# Patient Record
Sex: Female | Born: 1935 | Race: White | Hispanic: No | Marital: Married | Smoking: Never smoker
Health system: Southern US, Community
[De-identification: ages and names within clinical notes are randomized; demographics above are authoritative.]

## PROBLEM LIST (undated history)

## (undated) DIAGNOSIS — T8859XA Other complications of anesthesia, initial encounter: Secondary | ICD-10-CM

## (undated) DIAGNOSIS — T4145XA Adverse effect of unspecified anesthetic, initial encounter: Secondary | ICD-10-CM

## (undated) DIAGNOSIS — H547 Unspecified visual loss: Secondary | ICD-10-CM

## (undated) DIAGNOSIS — G8929 Other chronic pain: Secondary | ICD-10-CM

## (undated) DIAGNOSIS — M549 Dorsalgia, unspecified: Secondary | ICD-10-CM

## (undated) HISTORY — PX: ABDOMINAL HYSTERECTOMY: SHX81

## (undated) HISTORY — PX: BACK SURGERY: SHX140

## (undated) HISTORY — PX: TONSILLECTOMY: SUR1361

---

## 2002-06-08 ENCOUNTER — Emergency Department (HOSPITAL_COMMUNITY): Admission: EM | Admit: 2002-06-08 | Discharge: 2002-06-08 | Payer: Self-pay | Admitting: Emergency Medicine

## 2002-06-08 ENCOUNTER — Encounter: Payer: Self-pay | Admitting: Emergency Medicine

## 2008-08-17 ENCOUNTER — Encounter: Admission: RE | Admit: 2008-08-17 | Discharge: 2008-08-17 | Payer: Self-pay | Admitting: Neurosurgery

## 2008-10-25 ENCOUNTER — Inpatient Hospital Stay (HOSPITAL_COMMUNITY): Admission: RE | Admit: 2008-10-25 | Discharge: 2008-11-02 | Payer: Self-pay | Admitting: Neurosurgery

## 2008-10-27 ENCOUNTER — Ambulatory Visit: Payer: Self-pay | Admitting: Physical Medicine & Rehabilitation

## 2008-11-02 ENCOUNTER — Inpatient Hospital Stay (HOSPITAL_COMMUNITY)
Admission: RE | Admit: 2008-11-02 | Discharge: 2008-11-22 | Payer: Self-pay | Admitting: Physical Medicine & Rehabilitation

## 2008-11-15 ENCOUNTER — Ambulatory Visit: Payer: Self-pay | Admitting: Physical Medicine & Rehabilitation

## 2008-12-30 ENCOUNTER — Encounter: Admission: RE | Admit: 2008-12-30 | Discharge: 2008-12-30 | Payer: Self-pay | Admitting: Neurosurgery

## 2009-12-04 IMAGING — CR DG LUMBAR SPINE 2-3V
3 series · 3 of 3 positions shown · non-contrast
Comparison: 10/25/2008

CLINICAL DATA: Low back pain, postop.

LUMBAR SPINE - 2-3 VIEW

[t l-spine a.p.]
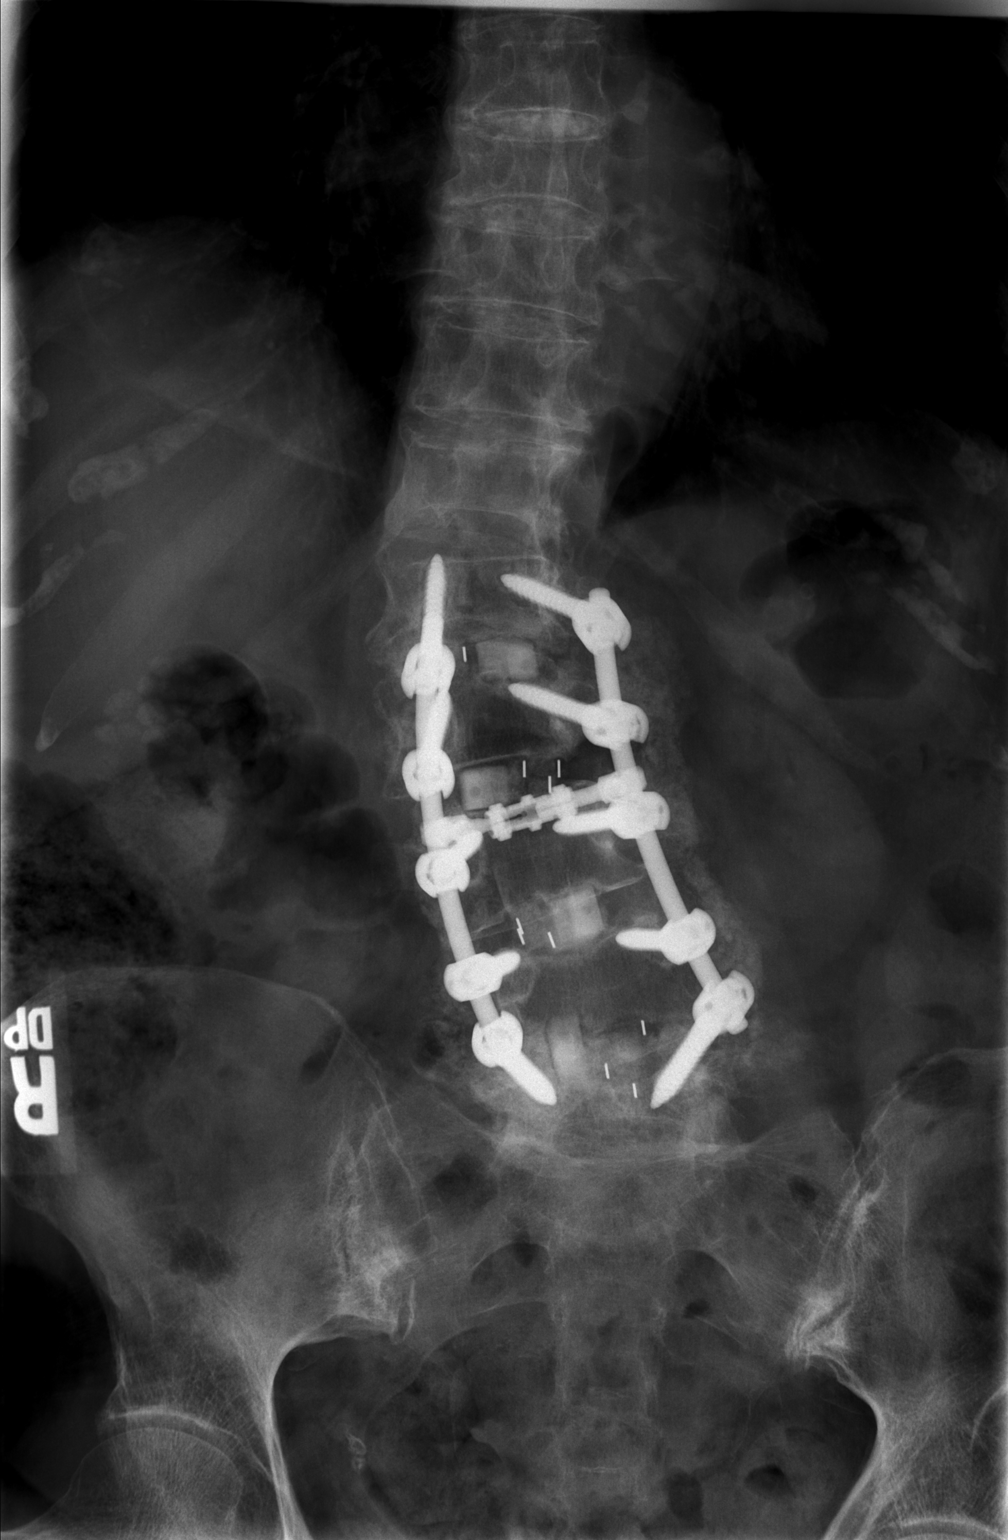

[t l-spine lat]
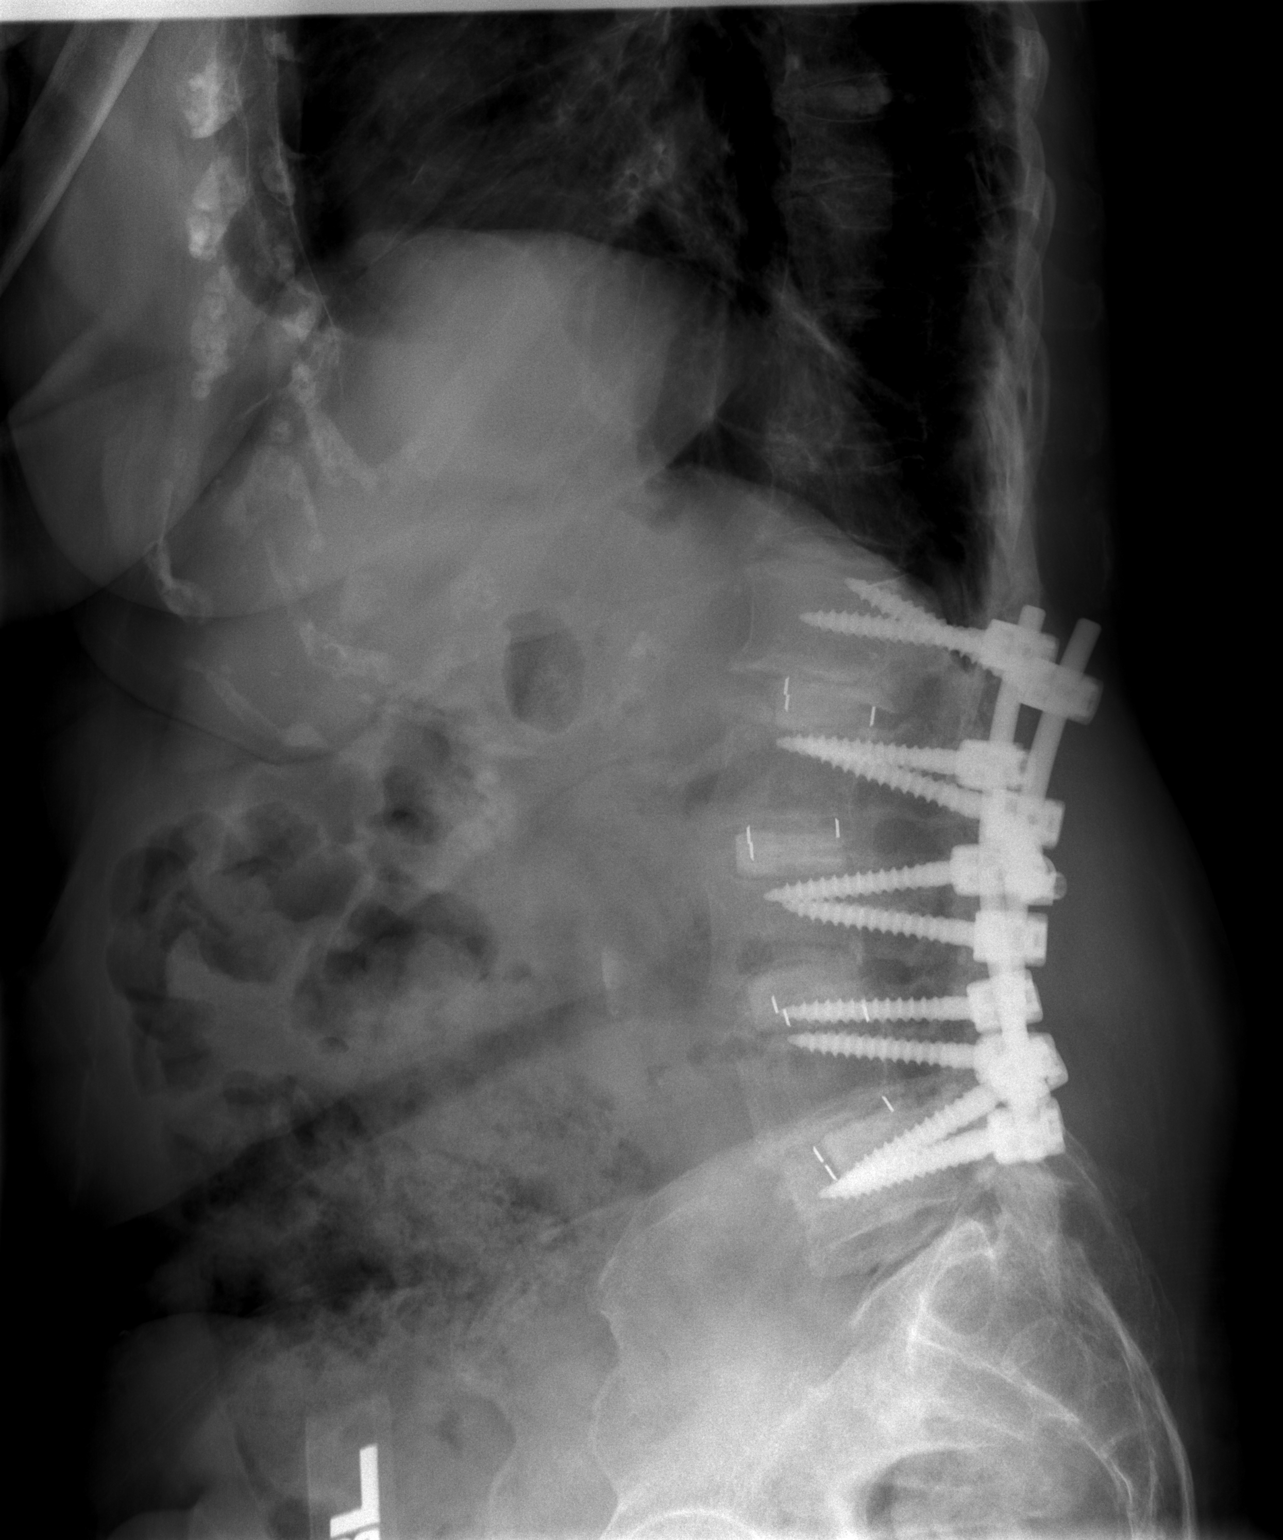

[t l-spine l5-s1 spot]
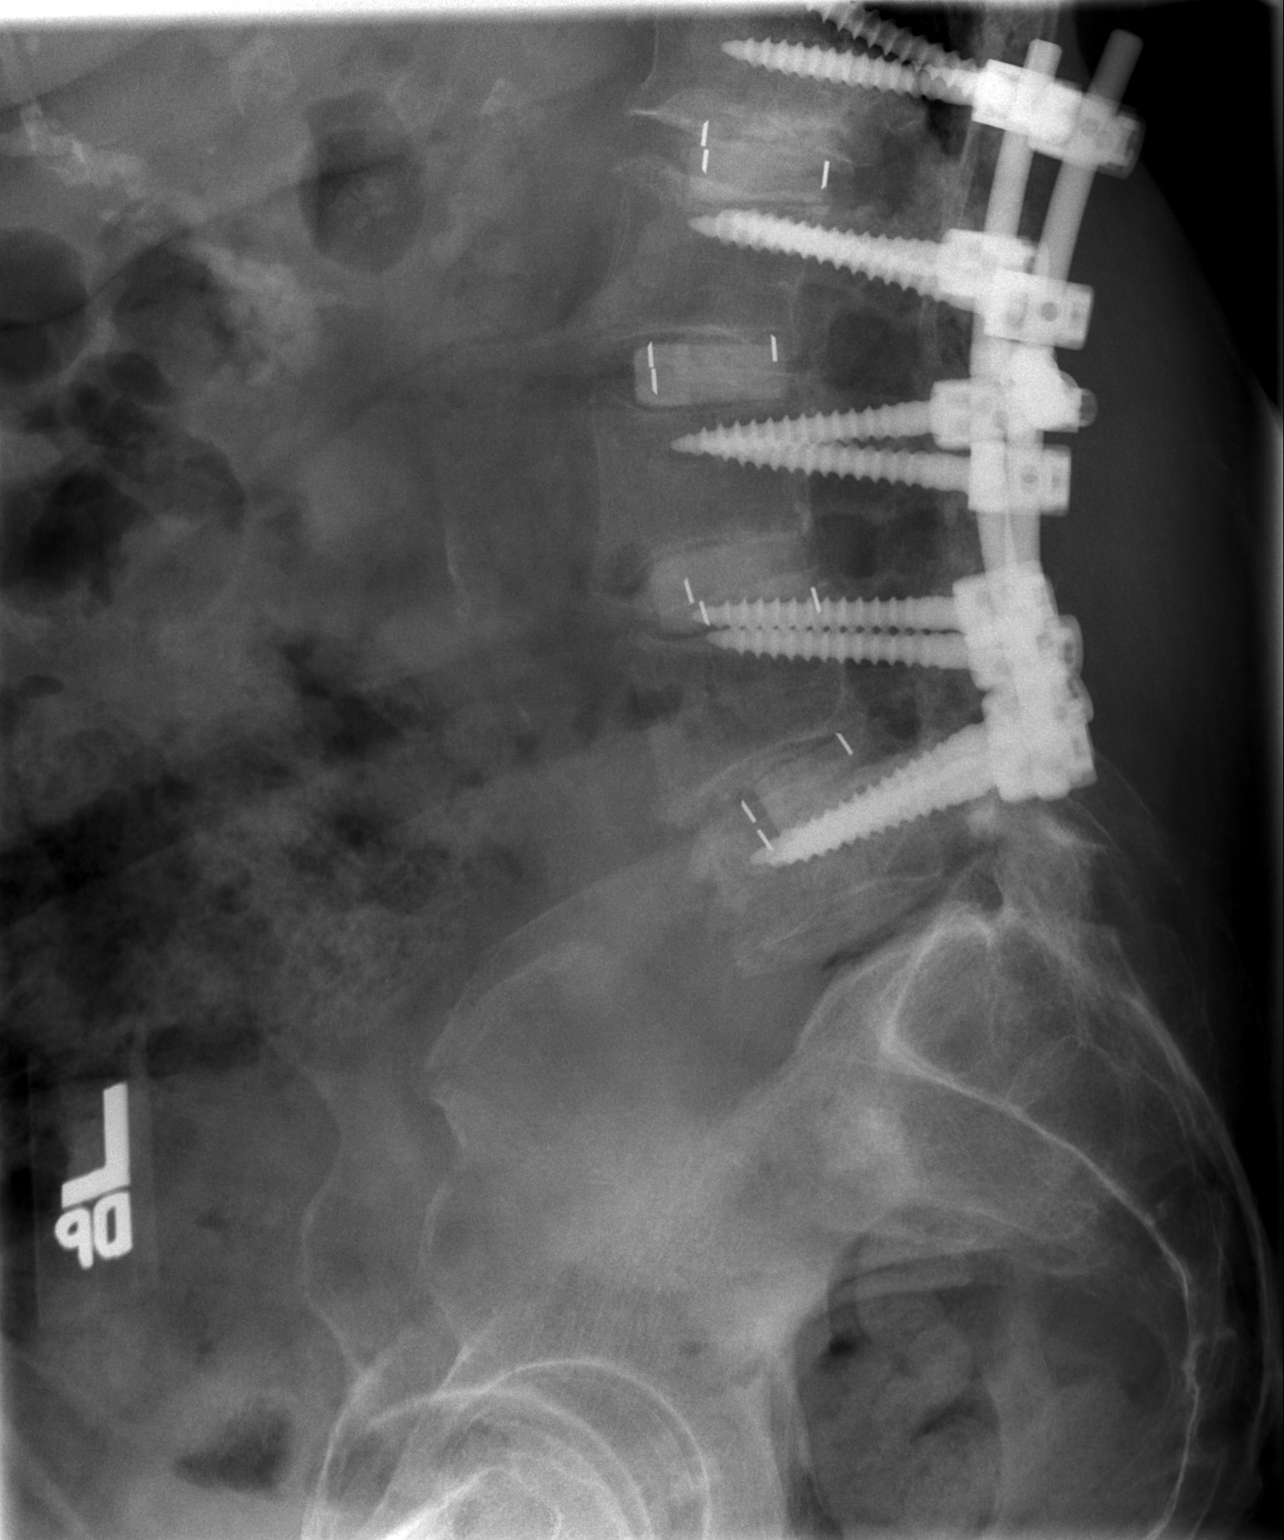

[3 of 3 positions shown; findings below may reference images not displayed]

FINDINGS: The patient is status post posterior fusion from L1-L5.
There is severe rightward scoliosis and associated degenerative
changes, stable since prior CT myelogram.  No hardware or bony
complicating feature.
IMPRESSION: Prior posterior fusion L1-L5.  Stable rightward scoliosis and
severe degenerative changes.  No complicating feature.

## 2010-06-02 LAB — SODIUM, URINE, RANDOM: Sodium, Ur: 11 mEq/L

## 2010-06-02 LAB — BRAIN NATRIURETIC PEPTIDE: Pro B Natriuretic peptide (BNP): 57 pg/mL (ref 0.0–100.0)

## 2010-06-02 LAB — BASIC METABOLIC PANEL
BUN: 14 mg/dL (ref 6–23)
BUN: 15 mg/dL (ref 6–23)
BUN: 15 mg/dL (ref 6–23)
BUN: 20 mg/dL (ref 6–23)
BUN: 20 mg/dL (ref 6–23)
BUN: 21 mg/dL (ref 6–23)
BUN: 23 mg/dL (ref 6–23)
CO2: 21 mEq/L (ref 19–32)
CO2: 22 mEq/L (ref 19–32)
CO2: 22 mEq/L (ref 19–32)
CO2: 23 mEq/L (ref 19–32)
CO2: 24 mEq/L (ref 19–32)
CO2: 24 mEq/L (ref 19–32)
CO2: 27 mEq/L (ref 19–32)
CO2: 27 mEq/L (ref 19–32)
CO2: 29 mEq/L (ref 19–32)
CO2: 29 mEq/L (ref 19–32)
CO2: 31 mEq/L (ref 19–32)
Calcium: 7.9 mg/dL — ABNORMAL LOW (ref 8.4–10.5)
Calcium: 8 mg/dL — ABNORMAL LOW (ref 8.4–10.5)
Calcium: 8.1 mg/dL — ABNORMAL LOW (ref 8.4–10.5)
Calcium: 8.3 mg/dL — ABNORMAL LOW (ref 8.4–10.5)
Calcium: 8.7 mg/dL (ref 8.4–10.5)
Chloride: 100 mEq/L (ref 96–112)
Chloride: 102 mEq/L (ref 96–112)
Chloride: 86 mEq/L — ABNORMAL LOW (ref 96–112)
Chloride: 89 mEq/L — ABNORMAL LOW (ref 96–112)
Chloride: 90 mEq/L — ABNORMAL LOW (ref 96–112)
Chloride: 90 mEq/L — ABNORMAL LOW (ref 96–112)
Chloride: 91 mEq/L — ABNORMAL LOW (ref 96–112)
Chloride: 93 mEq/L — ABNORMAL LOW (ref 96–112)
Chloride: 94 mEq/L — ABNORMAL LOW (ref 96–112)
Chloride: 101 mEq/L (ref 96–112)
Creatinine, Ser: 1.11 mg/dL (ref 0.4–1.2)
Creatinine, Ser: 1.29 mg/dL — ABNORMAL HIGH (ref 0.4–1.2)
Creatinine, Ser: 1.31 mg/dL — ABNORMAL HIGH (ref 0.4–1.2)
Creatinine, Ser: 1.44 mg/dL — ABNORMAL HIGH (ref 0.4–1.2)
Creatinine, Ser: 1.57 mg/dL — ABNORMAL HIGH (ref 0.4–1.2)
GFR calc Af Amer: 36 mL/min — ABNORMAL LOW (ref >60)
GFR calc Af Amer: 39 mL/min — ABNORMAL LOW (ref >60)
GFR calc Af Amer: 43 mL/min — ABNORMAL LOW (ref >60)
GFR calc Af Amer: 43 mL/min — ABNORMAL LOW (ref >60)
GFR calc Af Amer: >60 mL/min (ref >60)
GFR calc Af Amer: 37 mL/min — ABNORMAL LOW (ref >60)
GFR calc non Af Amer: 28 mL/min — ABNORMAL LOW (ref >60)
GFR calc non Af Amer: 30 mL/min — ABNORMAL LOW (ref >60)
GFR calc non Af Amer: 39 mL/min — ABNORMAL LOW (ref >60)
GFR calc non Af Amer: 42 mL/min — ABNORMAL LOW (ref >60)
GFR calc non Af Amer: 31 mL/min — ABNORMAL LOW (ref >60)
Glucose, Bld: 100 mg/dL — ABNORMAL HIGH (ref 70–99)
Glucose, Bld: 102 mg/dL — ABNORMAL HIGH (ref 70–99)
Glucose, Bld: 113 mg/dL — ABNORMAL HIGH (ref 70–99)
Glucose, Bld: 93 mg/dL (ref 70–99)
Glucose, Bld: 94 mg/dL (ref 70–99)
Glucose, Bld: 96 mg/dL (ref 70–99)
Glucose, Bld: 97 mg/dL (ref 70–99)
Glucose, Bld: 98 mg/dL (ref 70–99)
Glucose, Bld: 98 mg/dL (ref 70–99)
Potassium: 4.8 mEq/L (ref 3.5–5.1)
Potassium: 4.8 mEq/L (ref 3.5–5.1)
Potassium: 5 mEq/L (ref 3.5–5.1)
Potassium: 5.1 mEq/L (ref 3.5–5.1)
Potassium: 5.1 mEq/L (ref 3.5–5.1)
Potassium: 5.1 mEq/L (ref 3.5–5.1)
Potassium: 5.3 mEq/L — ABNORMAL HIGH (ref 3.5–5.1)
Potassium: 6 mEq/L — ABNORMAL HIGH (ref 3.5–5.1)
Potassium: 4.6 mEq/L (ref 3.5–5.1)
Sodium: 116 mEq/L — CL (ref 135–145)
Sodium: 118 mEq/L — CL (ref 135–145)
Sodium: 120 mEq/L — ABNORMAL LOW (ref 135–145)
Sodium: 123 mEq/L — ABNORMAL LOW (ref 135–145)
Sodium: 124 mEq/L — ABNORMAL LOW (ref 135–145)
Sodium: 126 mEq/L — ABNORMAL LOW (ref 135–145)
Sodium: 129 mEq/L — ABNORMAL LOW (ref 135–145)
Sodium: 131 mEq/L — ABNORMAL LOW (ref 135–145)

## 2010-06-02 LAB — DIFFERENTIAL
Basophils Absolute: 0 10*3/uL (ref 0.0–0.1)
Monocytes Absolute: 0.8 10*3/uL (ref 0.1–1.0)
Monocytes Relative: 9 % (ref 3–12)

## 2010-06-02 LAB — CBC
HCT: 24.7 % — ABNORMAL LOW (ref 36.0–46.0)
HCT: 26.8 % — ABNORMAL LOW (ref 36.0–46.0)
HCT: 28 % — ABNORMAL LOW (ref 36.0–46.0)
Hemoglobin: 8.3 g/dL — ABNORMAL LOW (ref 12.0–15.0)
Hemoglobin: 8.9 g/dL — ABNORMAL LOW (ref 12.0–15.0)
Hemoglobin: 8.8 g/dL — ABNORMAL LOW (ref 12.0–15.0)
Hemoglobin: 8.9 g/dL — ABNORMAL LOW (ref 12.0–15.0)
MCHC: 33.2 g/dL (ref 30.0–36.0)
MCHC: 33.4 g/dL (ref 30.0–36.0)
MCHC: 33.5 g/dL (ref 30.0–36.0)
MCHC: 32.8 g/dL (ref 30.0–36.0)
MCHC: 33.9 g/dL (ref 30.0–36.0)
MCV: 89.8 fL (ref 78.0–100.0)
MCV: 90.9 fL (ref 78.0–100.0)
MCV: 90.6 fL (ref 78.0–100.0)
MCV: 91.7 fL (ref 78.0–100.0)
Platelets: 246 10*3/uL (ref 150–400)
RBC: 2.75 MIL/uL — ABNORMAL LOW (ref 3.87–5.11)
RBC: 2.95 MIL/uL — ABNORMAL LOW (ref 3.87–5.11)
RBC: 3.12 MIL/uL — ABNORMAL LOW (ref 3.87–5.11)
RBC: 2.95 MIL/uL — ABNORMAL LOW (ref 3.87–5.11)
RBC: 3.09 MIL/uL — ABNORMAL LOW (ref 3.87–5.11)
RDW: 15.7 % — ABNORMAL HIGH (ref 11.5–15.5)
RDW: 15.8 % — ABNORMAL HIGH (ref 11.5–15.5)
RDW: 16 % — ABNORMAL HIGH (ref 11.5–15.5)
WBC: 3.2 10*3/uL — ABNORMAL LOW (ref 4.0–10.5)
WBC: 5.4 10*3/uL (ref 4.0–10.5)
WBC: 7.4 10*3/uL (ref 4.0–10.5)
WBC: 10.7 10*3/uL — ABNORMAL HIGH (ref 4.0–10.5)
WBC: 8.5 10*3/uL (ref 4.0–10.5)

## 2010-06-02 LAB — URINALYSIS, ROUTINE W REFLEX MICROSCOPIC
Bilirubin Urine: NEGATIVE
Glucose, UA: NEGATIVE mg/dL
Hgb urine dipstick: NEGATIVE
Ketones, ur: NEGATIVE mg/dL
Specific Gravity, Urine: 1.011 (ref 1.005–1.030)
pH: 6.5 (ref 5.0–8.0)

## 2010-06-02 LAB — URINE MICROSCOPIC-ADD ON

## 2010-06-02 LAB — COMPREHENSIVE METABOLIC PANEL
ALT: 12 U/L (ref 0–35)
AST: 22 U/L (ref 0–37)
AST: 19 U/L (ref 0–37)
Albumin: 2.7 g/dL — ABNORMAL LOW (ref 3.5–5.2)
Alkaline Phosphatase: 137 U/L — ABNORMAL HIGH (ref 39–117)
Alkaline Phosphatase: 64 U/L (ref 39–117)
CO2: 28 mEq/L (ref 19–32)
Calcium: 8.3 mg/dL — ABNORMAL LOW (ref 8.4–10.5)
Chloride: 93 mEq/L — ABNORMAL LOW (ref 96–112)
Chloride: 102 mEq/L (ref 96–112)
Creatinine, Ser: 1.29 mg/dL — ABNORMAL HIGH (ref 0.4–1.2)
GFR calc Af Amer: 48 mL/min — ABNORMAL LOW (ref >60)
GFR calc Af Amer: 53 mL/min — ABNORMAL LOW (ref >60)
GFR calc non Af Amer: 44 mL/min — ABNORMAL LOW (ref >60)
Glucose, Bld: 104 mg/dL — ABNORMAL HIGH (ref 70–99)
Potassium: 6.1 mEq/L — ABNORMAL HIGH (ref 3.5–5.1)
Potassium: 4.1 mEq/L (ref 3.5–5.1)
Sodium: 135 mEq/L (ref 135–145)
Total Bilirubin: 0.7 mg/dL (ref 0.3–1.2)
Total Protein: 5.6 g/dL — ABNORMAL LOW (ref 6.0–8.3)

## 2010-06-02 LAB — PROTIME-INR
INR: 2.1 — ABNORMAL HIGH (ref 0.00–1.49)
INR: 2.5 — ABNORMAL HIGH (ref 0.00–1.49)
INR: 1 (ref 0.00–1.49)
INR: 1.1 (ref 0.00–1.49)
INR: 1.4 (ref 0.00–1.49)
INR: 2.2 — ABNORMAL HIGH (ref 0.00–1.49)
INR: 2.2 — ABNORMAL HIGH (ref 0.00–1.49)
Prothrombin Time: 23.3 seconds — ABNORMAL HIGH (ref 11.6–15.2)
Prothrombin Time: 14.2 seconds (ref 11.6–15.2)
Prothrombin Time: 16.1 seconds — ABNORMAL HIGH (ref 11.6–15.2)
Prothrombin Time: 24 seconds — ABNORMAL HIGH (ref 11.6–15.2)
Prothrombin Time: 24.4 seconds — ABNORMAL HIGH (ref 11.6–15.2)

## 2010-06-02 LAB — LIPID PANEL: Cholesterol: 136 mg/dL (ref 0–200)

## 2010-06-02 LAB — URINE CULTURE: Colony Count: >=100000

## 2010-06-02 LAB — CHLORIDE, URINE, RANDOM: Chloride Urine: 16 mEq/L

## 2010-06-02 LAB — OSMOLALITY, URINE: Osmolality, Ur: 178 mOsm/kg — ABNORMAL LOW (ref 390–1090)

## 2010-06-02 LAB — VITAMIN B12: Vitamin B-12: 665 pg/mL (ref 211–911)

## 2010-06-02 LAB — CK: Total CK: 29 U/L (ref 7–177)

## 2010-06-03 LAB — CBC
HCT: 20.4 % — ABNORMAL LOW (ref 36.0–46.0)
HCT: 27.7 % — ABNORMAL LOW (ref 36.0–46.0)
HCT: 27.9 % — ABNORMAL LOW (ref 36.0–46.0)
Hemoglobin: 11 g/dL — ABNORMAL LOW (ref 12.0–15.0)
Hemoglobin: 9.3 g/dL — ABNORMAL LOW (ref 12.0–15.0)
Hemoglobin: 9.4 g/dL — ABNORMAL LOW (ref 12.0–15.0)
MCHC: 33.8 g/dL (ref 30.0–36.0)
MCHC: 34 g/dL (ref 30.0–36.0)
MCV: 89.8 fL (ref 78.0–100.0)
Platelets: 73 10*3/uL — ABNORMAL LOW (ref 150–400)
Platelets: 78 10*3/uL — ABNORMAL LOW (ref 150–400)
RBC: 3.09 MIL/uL — ABNORMAL LOW (ref 3.87–5.11)
RBC: 3.6 MIL/uL — ABNORMAL LOW (ref 3.87–5.11)
RDW: 16.2 % — ABNORMAL HIGH (ref 11.5–15.5)
RDW: 17.7 % — ABNORMAL HIGH (ref 11.5–15.5)
WBC: 11 10*3/uL — ABNORMAL HIGH (ref 4.0–10.5)
WBC: 8.1 10*3/uL (ref 4.0–10.5)
WBC: 8.1 10*3/uL (ref 4.0–10.5)

## 2010-06-03 LAB — POCT I-STAT 4, (NA,K, GLUC, HGB,HCT)
Glucose, Bld: 158 mg/dL — ABNORMAL HIGH (ref 70–99)
HCT: 24 % — ABNORMAL LOW (ref 36.0–46.0)
HCT: 25 % — ABNORMAL LOW (ref 36.0–46.0)
HCT: 30 % — ABNORMAL LOW (ref 36.0–46.0)
Hemoglobin: 10.2 g/dL — ABNORMAL LOW (ref 12.0–15.0)
Hemoglobin: 8.5 g/dL — ABNORMAL LOW (ref 12.0–15.0)
Potassium: 4.3 mEq/L (ref 3.5–5.1)
Sodium: 138 mEq/L (ref 135–145)

## 2010-06-03 LAB — BASIC METABOLIC PANEL
CO2: 27 mEq/L (ref 19–32)
Calcium: 9.6 mg/dL (ref 8.4–10.5)
Creatinine, Ser: 1.35 mg/dL — ABNORMAL HIGH (ref 0.4–1.2)
GFR calc Af Amer: 46 mL/min — ABNORMAL LOW (ref >60)
GFR calc non Af Amer: 38 mL/min — ABNORMAL LOW (ref >60)
Sodium: 139 mEq/L (ref 135–145)

## 2010-06-03 LAB — CROSSMATCH
ABO/RH(D): O POS
Antibody Screen: NEGATIVE

## 2010-06-03 LAB — APTT: aPTT: 29 seconds (ref 24–37)

## 2010-06-03 LAB — PROTIME-INR: INR: 1.7 — ABNORMAL HIGH (ref 0.00–1.49)

## 2010-06-03 LAB — PREPARE RBC (CROSSMATCH)

## 2010-06-03 LAB — DIFFERENTIAL
Lymphocytes Relative: 26 % (ref 12–46)
Monocytes Absolute: 0.7 10*3/uL (ref 0.1–1.0)
Monocytes Relative: 9 % (ref 3–12)
Neutro Abs: 4.9 10*3/uL (ref 1.7–7.7)
Neutrophils Relative %: 61 % (ref 43–77)

## 2010-06-03 LAB — ABO/RH: ABO/RH(D): O POS

## 2010-07-11 NOTE — Op Note (Signed)
Janice Barrett, Janice Barrett NO.:  [id number]   MEDICAL RECORD NO.:  [id number]          PATIENT TYPE:  INP   LOCATION:  2899                         FACILITY:  [facility]   PHYSICIAN:  Kathaleen Maser. Pool, M.D.    DATE OF BIRTH:  Mar 16, 1927   DATE OF PROCEDURE:  10/25/2008  DATE OF DISCHARGE:                               OPERATIVE REPORT   PREOPERATIVE DIAGNOSIS:  Lumbar degenerative scoliosis with lumbar  stenosis L1 through L5.   POSTOPERATIVE DIAGNOSIS:  Lumbar degenerative scoliosis with lumbar  stenosis L1 through L5.   PROCEDURE NOTE:  L1-2, L2-3, L3-4, and L4-5 decompressive laminectomy  with bilateral L1, L2, L3, L4, and L5 decompressive foraminotomies, more  than would be required for simple interbody fusion alone.  L1-2, L2-3,  L3-4, and L4-5 posterior lumbar fusion utilizing tangent interbody  allograft wedge, interbody PEEK cage, and local autografting.  L1  through L5 posterolateral arthrodesis utilizing segmental pedicle screw  sedation and local autograft.   SURGEON:  Kathaleen Maser. Pool, MD   ASSISTANT:  Donalee Citrin, MD   ANESTHESIA:  General endotracheal.   INDICATIONS:  Ms. Fesler is a 77 year old female with history of  severe back and lower extremity pain failing all conservative  management.  Workup demonstrates evidence of marked degenerative  scoliosis, which is decompensating with evidence of foraminal collapse  and stenosis.  The patient presents now for multilevel decompression and  fusion and hopes improving her symptoms.   OPERATIVE NOTE:  The patient was brought to the operating room, placed  on room table in supine position. After an adequate level of anesthesia  achieved, the patient was placed prone on Wilson frame, firmly padded,  the patient's lumbar regions prepped and draped sterilely.  A 10-blade  was used to make a curvilinear skin incision exposing the L1, L2, L3,  L4, and L5 levels as well as the transverse processes aforementioned  levels.  Deep self-retainer was placed. Intraoperative fluoroscopy was  used and levels were confirmed.  Decompressive laminectomy was then  performed using Leksell rongeurs, Kerrison rongeurs, high-speed drill to  remove the entire lamina of L1, L2, L3, and L4 as well as superior  aspect of the lamina of L5.  Inferior facetectomies L1, L2, L3, and L4  were performed bilaterally.  Superior facetectomies of L2, L3, L4, and  L5 were performed bilaterally.  Ligament flavum and epidural scar were  elevated and resected in a piecemeal fashion.  Wide decompressive  foraminotomy then performed along the course exiting L1, L2, L3, L4, and  L5 nerve roots.  Epidural venous plexus coagulated and cut.  Bilateral  diskectomies were performed at L4 levels.  Starting first at L1-2 disk  space was then distracted.  With disk space distracted, the disk space  was then reamed with 8-mm tangent box cutter and then cut with an 8-mm  tangent instruments. Soft tissue was then removed via interspace. A 8 x  22 mm Telamon cage was then packed in place on the right-sided, recessed  approximately 2 mm from posterior cortical margin.  Thecal sac nerve was  checked on the left side.  Distractors removed.  Disk space once again  reamed and then cut with 8-mm tangent instrument.  Soft tissues then  removed via interspace.  Disk space further curettaged.  Morselized  autograft packed into the interspace and then an 8 x 26 mm tangent wedge  was then packed into place and recessed approximately 1-2 mm from  posterior cortical margin.  Procedure then repeated at L2-3, L3-4, and  L4-5.  The 10-mm implants were used at L3-4 and L4-5.  Pedicles of L1,  L2, L3, L4, and L5 were then identified using surface landmarks with  intraoperative fluoroscopy.  Superficial bone was then removed using  high-speed drill. Each pedicle was then probed using pedicle awl.  Each  pedicle awl tract was then tapped.  Each tapped hole was then  probed and  found to be solid within bone.  The 5.75 x 40 mm radius screws placed  bilaterally at L2-L3 and L4.  The 6.75 x 40 mm screws placed bilaterally  at L5.  Transverse processes  at L1, L2, L3, L4, and L5 were then  decorticated and morselized autografts packed posterolaterally for later  fusion.  Short segment titanium rods were then placed over the screw  heads from L1-L5.  Locking caps were placed over the screw heads and  locking caps then engaged with construct under compression.  Transverse  connector was placed.  Final images were taken.  These revealed good  position of bone grafts and hardware with proper operative level and  normal alignment of spine.  Gelfoam was placed topically.  Hemostasis  and found to be good.  A medium Hemovac drains left at operative sites.  Surgical wounds then closed in layers with Vicryl suture.  Steri-Strips,  sterile dressings were applied. There are no complications.  She  tolerated the procedure well and she returns to recovery room  postoperatively.           ______________________________  Kathaleen Maser Pool, M.D.     HAP/MEDQ  D:  10/25/2008  T:  10/26/2008  Job:  [id number]

## 2011-11-20 ENCOUNTER — Other Ambulatory Visit (HOSPITAL_COMMUNITY): Payer: Self-pay | Admitting: Neurosurgery

## 2011-11-20 DIAGNOSIS — M48061 Spinal stenosis, lumbar region without neurogenic claudication: Secondary | ICD-10-CM

## 2011-11-26 ENCOUNTER — Ambulatory Visit (HOSPITAL_COMMUNITY)
Admission: RE | Admit: 2011-11-26 | Discharge: 2011-11-26 | Disposition: A | Discharge status: Home / Self Care | Payer: Medicare Other | Source: Ambulatory Visit | Attending: Neurosurgery | Admitting: Neurosurgery

## 2011-11-26 ENCOUNTER — Encounter (HOSPITAL_COMMUNITY): Payer: Self-pay

## 2011-11-26 ENCOUNTER — Ambulatory Visit (HOSPITAL_COMMUNITY): Payer: Medicare Other

## 2011-11-26 DIAGNOSIS — M48061 Spinal stenosis, lumbar region without neurogenic claudication: Secondary | ICD-10-CM | POA: Insufficient documentation

## 2011-11-26 DIAGNOSIS — S22009A Unspecified fracture of unspecified thoracic vertebra, initial encounter for closed fracture: Secondary | ICD-10-CM | POA: Insufficient documentation

## 2011-11-26 DIAGNOSIS — K8020 Calculus of gallbladder without cholecystitis without obstruction: Secondary | ICD-10-CM | POA: Insufficient documentation

## 2011-11-26 DIAGNOSIS — S322XXA Fracture of coccyx, initial encounter for closed fracture: Secondary | ICD-10-CM | POA: Insufficient documentation

## 2011-11-26 DIAGNOSIS — S3210XA Unspecified fracture of sacrum, initial encounter for closed fracture: Secondary | ICD-10-CM | POA: Insufficient documentation

## 2011-11-26 DIAGNOSIS — X58XXXA Exposure to other specified factors, initial encounter: Secondary | ICD-10-CM | POA: Insufficient documentation

## 2011-11-26 LAB — CREATININE, SERUM
GFR calc Af Amer: 43 mL/min — ABNORMAL LOW (ref >90)
GFR calc non Af Amer: 37 mL/min — ABNORMAL LOW (ref >90)

## 2011-11-26 MED ORDER — GADOBENATE DIMEGLUMINE 529 MG/ML IV SOLN
20.0000 mL | Freq: Once | INTRAVENOUS | Status: AC
  Administered 2011-11-26: 9 mL via INTRAVENOUS

## 2012-01-04 ENCOUNTER — Other Ambulatory Visit (HOSPITAL_COMMUNITY): Payer: Self-pay | Admitting: Neurosurgery

## 2012-01-04 DIAGNOSIS — IMO0002 Reserved for concepts with insufficient information to code with codable children: Secondary | ICD-10-CM

## 2012-01-09 ENCOUNTER — Other Ambulatory Visit (HOSPITAL_COMMUNITY): Payer: Self-pay | Admitting: Neurosurgery

## 2012-01-09 ENCOUNTER — Ambulatory Visit (HOSPITAL_COMMUNITY)
Admission: RE | Admit: 2012-01-09 | Discharge: 2012-01-09 | Disposition: A | Discharge status: Home / Self Care | Payer: Medicare Other | Source: Ambulatory Visit | Attending: Neurosurgery | Admitting: Neurosurgery

## 2012-01-09 DIAGNOSIS — IMO0002 Reserved for concepts with insufficient information to code with codable children: Secondary | ICD-10-CM

## 2012-01-09 MED ORDER — TOBRAMYCIN SULFATE 1.2 G IJ SOLR
INTRAMUSCULAR | Status: AC
  Filled 2012-01-09: qty 1.2

## 2012-01-10 ENCOUNTER — Other Ambulatory Visit (HOSPITAL_COMMUNITY): Payer: Self-pay | Admitting: Interventional Radiology

## 2012-01-10 DIAGNOSIS — S3210XA Unspecified fracture of sacrum, initial encounter for closed fracture: Secondary | ICD-10-CM

## 2012-01-11 ENCOUNTER — Other Ambulatory Visit: Payer: Self-pay | Admitting: Radiology

## 2012-01-11 ENCOUNTER — Encounter (HOSPITAL_COMMUNITY): Payer: Self-pay | Admitting: Pharmacy Technician

## 2012-01-14 ENCOUNTER — Other Ambulatory Visit: Payer: Self-pay | Admitting: Radiology

## 2012-01-14 ENCOUNTER — Ambulatory Visit (HOSPITAL_COMMUNITY)
Admission: RE | Admit: 2012-01-14 | Discharge: 2012-01-14 | Disposition: A | Discharge status: Home / Self Care | Payer: Medicare Other | Source: Ambulatory Visit | Attending: Interventional Radiology | Admitting: Interventional Radiology

## 2012-01-14 ENCOUNTER — Encounter (HOSPITAL_COMMUNITY): Payer: Self-pay

## 2012-01-14 DIAGNOSIS — W19XXXA Unspecified fall, initial encounter: Secondary | ICD-10-CM | POA: Insufficient documentation

## 2012-01-14 DIAGNOSIS — M8448XA Pathological fracture, other site, initial encounter for fracture: Secondary | ICD-10-CM | POA: Insufficient documentation

## 2012-01-14 DIAGNOSIS — S3210XA Unspecified fracture of sacrum, initial encounter for closed fracture: Secondary | ICD-10-CM

## 2012-01-14 DIAGNOSIS — Z538 Procedure and treatment not carried out for other reasons: Secondary | ICD-10-CM | POA: Insufficient documentation

## 2012-01-14 DIAGNOSIS — Z7901 Long term (current) use of anticoagulants: Secondary | ICD-10-CM | POA: Insufficient documentation

## 2012-01-14 DIAGNOSIS — I10 Essential (primary) hypertension: Secondary | ICD-10-CM | POA: Insufficient documentation

## 2012-01-14 LAB — CBC WITH DIFFERENTIAL/PLATELET
Basophils Absolute: 0.1 10*3/uL (ref 0.0–0.1)
Basophils Relative: 1 % (ref 0–1)
Eosinophils Absolute: 0.3 10*3/uL (ref 0.0–0.7)
HCT: 34.3 % — ABNORMAL LOW (ref 36.0–46.0)
MCH: 27.9 pg (ref 26.0–34.0)
MCHC: 31.5 g/dL (ref 30.0–36.0)
Monocytes Absolute: 0.7 10*3/uL (ref 0.1–1.0)
Monocytes Relative: 11 % (ref 3–12)
Neutro Abs: 3.8 10*3/uL (ref 1.7–7.7)
Neutrophils Relative %: 60 % (ref 43–77)
RDW: 15.6 % — ABNORMAL HIGH (ref 11.5–15.5)

## 2012-01-14 LAB — BASIC METABOLIC PANEL
BUN: 21 mg/dL (ref 6–23)
Calcium: 9.3 mg/dL (ref 8.4–10.5)
Chloride: 104 mEq/L (ref 96–112)
Creatinine, Ser: 1.28 mg/dL — ABNORMAL HIGH (ref 0.50–1.10)
GFR calc Af Amer: 44 mL/min — ABNORMAL LOW (ref >90)
GFR calc non Af Amer: 38 mL/min — ABNORMAL LOW (ref >90)

## 2012-01-14 LAB — APTT: aPTT: 39 seconds — ABNORMAL HIGH (ref 24–37)

## 2012-01-14 MED ORDER — CEFAZOLIN SODIUM 1-5 GM-% IV SOLN: 1.0000 g | INTRAVENOUS | Status: DC

## 2012-01-14 MED ORDER — SODIUM CHLORIDE 0.9 % IV SOLN: INTRAVENOUS | Status: DC

## 2012-01-14 NOTE — H&P (Signed)
Chief Complaint: Back pain  HPI: Janice Barrett is an 78 y.o. female who fell on her back  which resulted in excruciating pain to the lower back and sacral  area. The patient was initially seen by her neurosurgeon given  that she has had lumbar surgery in the past and MRI was performed  revealing sacral fractures bilaterally. The patient denies any  radicular symptoms down the legs or numbness; however describes  muscle pain of the upper legs bilaterally. The patient has had no  changes in her urinary stress incontinence. The patient denies  bowel incontinence. She rates her pain as a 10 on a scale of 1-10  without medications. With her Oxycodone her pain scale is reduced  to approximately five. Prior to this injury, the patient was able  to ambulate around the house and in the yard pretty well with the  assistance of a cane. Since her injury she has had limited  mobility secondary to pain. She is able to lay on her right side  to sleep at night. Sitting on hard surfaces or for extended period  of time, prolonged standing or change in position significantly  exacerbates her pain. Her MRI revealed S2 fractures and she is scheduled for augmentation.  Past Medical History:  1. History of stroke approximately 12 years ago on chronic  anticoagulation.  2. Hypertension  3. Hypercholesterolemia  4. Stress incontinence  5. Osteoporosis  6. Obesity  7. Chronic lumbar pain status post fusion from L1-L5  8. Glaucoma bilaterally with extremely poor visual acuity  Past Surgical History:  1. Lumbar fusion performed by Dr. Jordan Likes levels one through five  2. Surgery to both eyes x four at [facility] secondary to  glaucoma  3. Vaginal hysterectomy  4. Tonsillectomy as a child  Family History: No family history on file.  Social History:   The patient is married times 63 years and currently lives with her  husband. They have four living children. She was a housewife.  The patient denies  smoking or alcohol use.  Allergies:  Allergies  Allergen Reactions  . Septra (Sulfamethoxazole W-Trimethoprim) Hives    Medications: 1. Warfarin 5 mg daily x five; 7.5 mg 2 days of the week  2. Lisinopril 20 mg daily  3. Simvastatin 40 mg daily  4. Aspirin 81 mg daily  5. Oxycontin 10 mg 1-2 tablets every 6-8 hours as needed for pain   Please HPI for pertinent positives, otherwise complete 10 system ROS negative.  Physical Exam: Blood pressure 159/74, pulse 72, temperature 97.6 F (36.4 C), temperature source Oral, resp. rate 16, height 5\' 3"  (1.6 m), weight 181 lb (82.101 kg), SpO2 96.00%. Body mass index is 32.06 kg/(m^2).   General Appearance:  Alert, cooperative, no distress, appears stated age  Head:  Normocephalic, without obvious abnormality, atraumatic  ENT: Unremarkable  Neck: Supple, symmetrical, trachea midline, no adenopathy, thyroid: not enlarged, symmetric, no tenderness/mass/nodules  Lungs:   Clear to auscultation bilaterally, no w/r/r, respirations unlabored without use of accessory muscles.  Chest Wall:  No tenderness or deformity  Heart:  Regular rate and rhythm, S1, S2 normal, no murmur, rub or gallop. Carotids 2+ without bruit.  Neurologic: Normal affect, no gross deficits.   No results found for this or any previous visit (from the past 48 hour(s)). No results found.  Assessment/Plan Low back pain  Sacral fractures at S2 level. For Sacroplasty today Coumadin has been held, await labs. Procedure and risks discussed with pt and family.  Consent signed in chart  Brayton El PA-C 01/14/2012, 8:43 AM

## 2012-01-16 ENCOUNTER — Other Ambulatory Visit (HOSPITAL_COMMUNITY): Payer: Self-pay | Admitting: Interventional Radiology

## 2012-01-16 ENCOUNTER — Ambulatory Visit (HOSPITAL_COMMUNITY)
Admission: RE | Admit: 2012-01-16 | Discharge: 2012-01-16 | Disposition: A | Discharge status: Home / Self Care | Payer: Medicare Other | Source: Ambulatory Visit | Attending: Interventional Radiology | Admitting: Interventional Radiology

## 2012-01-16 VITALS — BP 122/48 | HR 72 | Temp 97.8°F | Resp 18 | Ht 63.0 in | Wt 181.0 lb

## 2012-01-16 DIAGNOSIS — I10 Essential (primary) hypertension: Secondary | ICD-10-CM | POA: Insufficient documentation

## 2012-01-16 DIAGNOSIS — S3210XA Unspecified fracture of sacrum, initial encounter for closed fracture: Secondary | ICD-10-CM

## 2012-01-16 DIAGNOSIS — Z8673 Personal history of transient ischemic attack (TIA), and cerebral infarction without residual deficits: Secondary | ICD-10-CM | POA: Insufficient documentation

## 2012-01-16 DIAGNOSIS — W19XXXA Unspecified fall, initial encounter: Secondary | ICD-10-CM | POA: Insufficient documentation

## 2012-01-16 DIAGNOSIS — G8929 Other chronic pain: Secondary | ICD-10-CM | POA: Insufficient documentation

## 2012-01-16 DIAGNOSIS — Z981 Arthrodesis status: Secondary | ICD-10-CM | POA: Insufficient documentation

## 2012-01-16 DIAGNOSIS — Z7901 Long term (current) use of anticoagulants: Secondary | ICD-10-CM | POA: Insufficient documentation

## 2012-01-16 DIAGNOSIS — M810 Age-related osteoporosis without current pathological fracture: Secondary | ICD-10-CM | POA: Insufficient documentation

## 2012-01-16 LAB — PROTIME-INR: Prothrombin Time: 14.7 seconds (ref 11.6–15.2)

## 2012-01-16 MED ORDER — SODIUM CHLORIDE 0.9 % IV SOLN: INTRAVENOUS | Status: DC

## 2012-01-16 MED ORDER — DEXAMETHASONE SODIUM PHOSPHATE 10 MG/ML IJ SOLN
INTRAMUSCULAR | Status: AC
  Administered 2012-01-16: 10 mg
  Filled 2012-01-16: qty 1

## 2012-01-16 MED ORDER — FAMOTIDINE 20 MG PO TABS
20.0000 mg | ORAL_TABLET | Freq: Once | ORAL | Status: AC
  Administered 2012-01-16: 20 mg via ORAL

## 2012-01-16 MED ORDER — CEFAZOLIN SODIUM-DEXTROSE 2-3 GM-% IV SOLR
2.0000 g | Freq: Once | INTRAVENOUS | Status: AC
  Administered 2012-01-16: 2 g via INTRAVENOUS
  Filled 2012-01-16: qty 50

## 2012-01-16 MED ORDER — HYDROMORPHONE HCL PF 1 MG/ML IJ SOLN
INTRAMUSCULAR | Status: AC
  Filled 2012-01-16: qty 1

## 2012-01-16 MED ORDER — TOBRAMYCIN SULFATE 1.2 G IJ SOLR
INTRAMUSCULAR | Status: AC
  Filled 2012-01-16: qty 1.2

## 2012-01-16 MED ORDER — SODIUM CHLORIDE 0.9 % IV SOLN: INTRAVENOUS | Status: AC

## 2012-01-16 MED ORDER — FENTANYL CITRATE 0.05 MG/ML IJ SOLN
INTRAMUSCULAR | Status: AC
  Filled 2012-01-16: qty 4

## 2012-01-16 MED ORDER — MIDAZOLAM HCL 2 MG/2ML IJ SOLN
INTRAMUSCULAR | Status: AC | PRN
  Administered 2012-01-16 (×2): 1 mg via INTRAVENOUS

## 2012-01-16 MED ORDER — GELATIN ABSORBABLE 12-7 MM EX MISC
CUTANEOUS | Status: AC
  Filled 2012-01-16: qty 1

## 2012-01-16 MED ORDER — FENTANYL CITRATE 0.05 MG/ML IJ SOLN
INTRAMUSCULAR | Status: AC | PRN
  Administered 2012-01-16 (×2): 25 ug via INTRAVENOUS

## 2012-01-16 MED ORDER — MIDAZOLAM HCL 2 MG/2ML IJ SOLN
INTRAMUSCULAR | Status: AC
  Filled 2012-01-16: qty 4

## 2012-01-16 MED ORDER — DEXAMETHASONE SODIUM PHOSPHATE 4 MG/ML IJ SOLN
4.0000 mg | Freq: Once | INTRAMUSCULAR | Status: DC
  Filled 2012-01-16: qty 1

## 2012-01-16 MED ORDER — FAMOTIDINE 20 MG PO TABS
ORAL_TABLET | ORAL | Status: AC
  Filled 2012-01-16: qty 1

## 2012-01-16 MED ORDER — IOHEXOL 300 MG/ML  SOLN
50.0000 mL | Freq: Once | INTRAMUSCULAR | Status: AC | PRN
  Administered 2012-01-16: 5 mL via INTRAVENOUS

## 2012-01-16 NOTE — Progress Notes (Signed)
PER PAMELA CAMPBELL CLIENT MAY RESUME COUMADIN TOMORROW

## 2012-01-16 NOTE — Progress Notes (Signed)
ICE PACK TO BACK 

## 2012-01-16 NOTE — Procedures (Signed)
S/P S1 vertebroplasty  

## 2012-01-16 NOTE — H&P (Signed)
Cc: painful sacral fractures. Patient returns today for sacroplasty if INR is WNL to safely proceed after coumadin has been held.   Brayton El, PA Physician Assistant Cosign Needed Radiology H&P 01/14/2012 8:43 AM  Related encounter: IR MCIR1/MCIR2/WLIR1 from 01/14/2012 in [facility] INTERVENTIONAL RADIOLOGY  Chief Complaint: Back pain  HPI: Janice Barrett is an 78 y.o. female who fell on her back  which resulted in excruciating pain to the lower back and sacral   area. The patient was initially seen by her neurosurgeon given   that she has had lumbar surgery in the past and MRI was performed   revealing sacral fractures bilaterally. The patient denies any   radicular symptoms down the legs or numbness; however describes   muscle pain of the upper legs bilaterally. The patient has had no   changes in her urinary stress incontinence. The patient denies   bowel incontinence. She rates her pain as a 10 on a scale of 1-10   without medications. With her Oxycodone her pain scale is reduced   to approximately five. Prior to this injury, the patient was able   to ambulate around the house and in the yard pretty well with the   assistance of a cane. Since her injury she has had limited   mobility secondary to pain. She is able to lay on her right side   to sleep at night. Sitting on hard surfaces or for extended period   of time, prolonged standing or change in position significantly   exacerbates her pain. Her MRI revealed S2 fractures and she is scheduled for augmentation.  Past Medical History:   1. History of stroke approximately 12 years ago on chronic   anticoagulation.   2. Hypertension   3. Hypercholesterolemia   4. Stress incontinence   5. Osteoporosis   6. Obesity   7. Chronic lumbar pain status post fusion from L1-L5   8. Glaucoma bilaterally with extremely poor visual acuity  Past Surgical History:   1. Lumbar fusion performed by Dr. Jordan Likes  levels one through five   2. Surgery to both eyes x four at [facility] secondary to   glaucoma   3. Vaginal hysterectomy   4. Tonsillectomy as a child  Family History: No family history on file.  Social History:   The patient is married times 63 years and currently lives with her   husband. They have four living children. She was a housewife.   The patient denies smoking or alcohol use.  Allergies:  Allergies   Allergen  Reactions   .  Septra (Sulfamethoxazole W-Trimethoprim)  Hives     Medications: 1. Warfarin 5 mg daily x five; 7.5 mg 2 days of the week   2. Lisinopril 20 mg daily   3. Simvastatin 40 mg daily   4. Aspirin 81 mg daily   5. Oxycontin 10 mg 1-2 tablets every 6-8 hours as needed for pain   Please HPI for pertinent positives, otherwise complete 10 system ROS negative.  Physical Exam: Blood pressure 159/74, pulse 72, temperature 97.6 F (36.4 C), temperature source Oral, resp. rate 16, height 5\' 3"  (1.6 m), weight 181 lb (82.101 kg), SpO2 96.00%. Body mass index is 32.06 kg/(m^2).     General Appearance:   Alert, cooperative, no distress, appears stated age   Head:   Normocephalic, without obvious abnormality, atraumatic   ENT:  Unremarkable   Neck:  Supple, symmetrical, trachea midline, no adenopathy, thyroid: not enlarged,  symmetric, no tenderness/mass/nodules   Lungs:    Clear to auscultation bilaterally, no w/r/r, respirations unlabored without use of accessory muscles.   Chest Wall:   No tenderness or deformity   Heart:   Regular rate and rhythm, S1, S2 normal, no murmur, rub or gallop. Carotids 2+ without bruit.   Neurologic:  Normal affect, no gross deficits.    No results found for this or any previous visit (from the past 48 hour(s)). No results found.  Assessment/Plan Low back pain   Sacral fractures at S2 level. For Sacroplasty today Coumadin has been held, await labs. Procedure and risks discussed with pt and family. Consent signed in  chart  Brayton El PA-C 01/14/2012, 8:43 AM    No significant changes in PE today.  Patient has continued to hold coumadin. She denies any significant changes in her pain since seen in consultation and denies any new injuries since that time.  Agree with PE as above.   New labs drawn today :   INR 1.17, PTT: 14.7   A/P/ :  Sacral insufficiency fractures amendable to sacroplasty. For procedure today as INR WNL to proceed. Written consent obtained.

## 2012-01-17 ENCOUNTER — Telehealth (HOSPITAL_COMMUNITY): Payer: Self-pay | Admitting: *Deleted

## 2012-01-18 ENCOUNTER — Other Ambulatory Visit: Payer: Self-pay | Admitting: Internal Medicine

## 2012-02-08 ENCOUNTER — Other Ambulatory Visit (HOSPITAL_COMMUNITY): Payer: Self-pay | Admitting: Interventional Radiology

## 2012-02-11 ENCOUNTER — Other Ambulatory Visit (HOSPITAL_COMMUNITY): Payer: Self-pay | Admitting: Interventional Radiology

## 2012-02-11 DIAGNOSIS — IMO0002 Reserved for concepts with insufficient information to code with codable children: Secondary | ICD-10-CM

## 2012-03-10 ENCOUNTER — Ambulatory Visit (HOSPITAL_COMMUNITY)
Admission: RE | Admit: 2012-03-10 | Discharge: 2012-03-10 | Disposition: A | Discharge status: Home / Self Care | Payer: Medicare Other | Source: Ambulatory Visit | Attending: Interventional Radiology | Admitting: Interventional Radiology

## 2012-03-10 DIAGNOSIS — IMO0002 Reserved for concepts with insufficient information to code with codable children: Secondary | ICD-10-CM

## 2012-08-25 ENCOUNTER — Other Ambulatory Visit (HOSPITAL_COMMUNITY): Payer: Self-pay | Admitting: Interventional Radiology

## 2012-08-25 DIAGNOSIS — M549 Dorsalgia, unspecified: Secondary | ICD-10-CM

## 2012-09-01 ENCOUNTER — Encounter (HOSPITAL_COMMUNITY): Payer: Self-pay | Admitting: Emergency Medicine

## 2012-09-01 ENCOUNTER — Ambulatory Visit (HOSPITAL_COMMUNITY)
Admission: RE | Admit: 2012-09-01 | Discharge: 2012-09-01 | Disposition: A | Discharge status: Home / Self Care | Payer: Medicare Other | Source: Ambulatory Visit | Attending: Interventional Radiology | Admitting: Interventional Radiology

## 2012-09-01 ENCOUNTER — Emergency Department (HOSPITAL_COMMUNITY)
Admission: EM | Admit: 2012-09-01 | Discharge: 2012-09-01 | Disposition: A | Discharge status: Home / Self Care | Payer: Medicare Other | Attending: Emergency Medicine | Admitting: Emergency Medicine

## 2012-09-01 DIAGNOSIS — Z881 Allergy status to other antibiotic agents status: Secondary | ICD-10-CM | POA: Insufficient documentation

## 2012-09-01 DIAGNOSIS — H543 Unqualified visual loss, both eyes: Secondary | ICD-10-CM | POA: Insufficient documentation

## 2012-09-01 DIAGNOSIS — M549 Dorsalgia, unspecified: Secondary | ICD-10-CM | POA: Insufficient documentation

## 2012-09-01 DIAGNOSIS — Z7901 Long term (current) use of anticoagulants: Secondary | ICD-10-CM | POA: Insufficient documentation

## 2012-09-01 DIAGNOSIS — Z7982 Long term (current) use of aspirin: Secondary | ICD-10-CM | POA: Insufficient documentation

## 2012-09-01 DIAGNOSIS — Z79899 Other long term (current) drug therapy: Secondary | ICD-10-CM | POA: Insufficient documentation

## 2012-09-01 DIAGNOSIS — G8929 Other chronic pain: Secondary | ICD-10-CM | POA: Insufficient documentation

## 2012-09-01 HISTORY — DX: Dorsalgia, unspecified: M54.9

## 2012-09-01 HISTORY — DX: Unspecified visual loss: H54.7

## 2012-09-01 HISTORY — DX: Other chronic pain: G89.29

## 2012-09-01 NOTE — ED Provider Notes (Signed)
History    This chart was scribed for non-physician practitioner Janice Forth, PA working with Janice Munch, MD by Janice Barrett, ED Scribe. This patient was seen in room TR06C/TR06C and the patient's care was started at 3:28 PM .   CSN: [id number]  Arrival date & time 09/01/12  1410    Chief Complaint  Patient presents with  . Back Pain    The history is provided by the patient. No language interpreter was used.     HPI Comments: Janice Barrett is a 79 y.o. female who presents to the Emergency Department complaining of chronic, unchanged back pain.  Pt reports that she had concrete placed in her back by her interventional radiologist (Dr. Corliss Barrett) 6 months ago and was advised to return if her pain did not improve.  Pt states she had an appointment with Dr. Corliss Barrett at 2 PM today but she reports that she was given the address for Urgent Care by mistake.  When she arrived at [facility] she states that she was was transported from there to the ER.  She denies changes in back pain over the past 6 months and notes that she did not intend to come to the emergency room.  Pt medicates regularly with oxycodone (3x/day) for her back pain.   Past Medical History  Diagnosis Date  . Back pain, chronic   . Blind     Past Surgical History  Procedure Laterality Date  . Back surgery      No family history on file.   History  Substance Use Topics  . Smoking status: Not on file  . Smokeless tobacco: Not on file  . Alcohol Use: No    OB History   Grav Para Term Preterm Abortions TAB SAB Ect Mult Living                   Review of Systems  Constitutional: Negative for diaphoresis, appetite change, fatigue and unexpected weight change.  HENT: Negative for mouth sores and neck stiffness.   Eyes: Negative for visual disturbance.  Respiratory: Negative for cough, chest tightness, shortness of breath and wheezing.   Gastrointestinal: Negative for nausea, vomiting, diarrhea  and constipation.  Endocrine: Negative for polydipsia, polyphagia and polyuria.  Genitourinary: Negative for urgency, frequency and hematuria.  Musculoskeletal: Positive for back pain.  Skin: Negative for rash.  Allergic/Immunologic: Negative for immunocompromised state.  Neurological: Negative for syncope and light-headedness.  Hematological: Does not bruise/bleed easily.  Psychiatric/Behavioral: Negative for sleep disturbance. The patient is not nervous/anxious.       Allergies  Septra  Home Medications   Current Outpatient Rx  Name  Route  Sig  Dispense  Refill  . aspirin 81 MG tablet   Oral   Take 81 mg by mouth daily.         . hydroxypropyl methylcellulose (ISOPTO TEARS) 2.5 % ophthalmic solution   Both Eyes   Place 1 drop into both eyes 4 (four) times daily as needed (dry eyes).          Marland Kitchen lisinopril (PRINIVIL,ZESTRIL) 20 MG tablet   Oral   Take 20 mg by mouth at bedtime.          Marland Kitchen oxyCODONE (OXYCONTIN) 10 MG 12 hr tablet   Oral   Take 10 mg by mouth 3 (three) times daily.         . simvastatin (ZOCOR) 40 MG tablet   Oral   Take 40 mg by mouth every evening.         Marland Kitchen  timolol (TIMOPTIC) 0.5 % ophthalmic solution   Right Eye   Place 1 drop into the right eye 2 (two) times daily.         Marland Kitchen warfarin (COUMADIN) 5 MG tablet   Oral   Take 5-7.5 mg by mouth daily. 5 mg daily except 7.5 mg on wednesdays and sundays          BP 140/51  Pulse 66  Temp(Src) 98.9 F (37.2 C) (Oral)  Resp 15  SpO2 96%  Physical Exam  Nursing note and vitals reviewed. Constitutional: She is oriented to person, place, and time. She appears well-developed and well-nourished. No distress.  HENT:  Head: Normocephalic and atraumatic.  Eyes: Conjunctivae are normal. Pupils are equal, round, and reactive to light.  Neck: Normal range of motion.  Cardiovascular: Normal rate and normal heart sounds.   No murmur heard. Pulmonary/Chest: Effort normal. No respiratory  distress. She has no wheezes.  Neurological: She is alert and oriented to person, place, and time. She exhibits normal muscle tone. Coordination normal.  Skin: Skin is warm and dry. No erythema.  Psychiatric: She has a normal mood and affect. Her behavior is normal.    ED Course  Procedures (including critical care time)   DIAGNOSTIC STUDIES: Oxygen Saturation is 96% on room air, normal by my interpretation.    COORDINATION OF CARE: 3:32 PM-Discussed treatment plan which includes consult with Dr. Corliss Barrett with pt at bedside and pt agreed to plan.     Labs Reviewed - No data to display  No results found.  1. Back pain, chronic     MDM  Janice Barrett presents for appointment with Dr Janice Barrett. Discussed with by our receptionist and with Dr. Corliss Barrett scheduler.  Patient is without acute change in her back pain. She has no neurological complaints.  Dr Janice Barrett scheduler met with the patient. She reports that the patient should be discharged from the emergency room and she will take her scheduled appointment. Patient discharged without further evaluation.  Janice Barrett was consulted about the patient and situation, but she was moved prior to his exam.   Janice Forth, PA-C 09/01/12 2352

## 2012-09-01 NOTE — ED Notes (Signed)
IR staff took pt to radiology to make an appointment and stated that she would review the d/c material with the pt and get the pt set up with another appt to see Dr. Robyn Haber.

## 2012-09-01 NOTE — ED Notes (Signed)
Pt. Stated, I have back pain, I had it cemented 6 months ago and told to come back if no better in 6 months.

## 2012-09-02 NOTE — ED Provider Notes (Signed)
  Medical screening examination/treatment/procedure(s) were performed by non-physician practitioner and as supervising physician I was immediately available for consultation/collaboration.    Gerhard Munch, MD 09/02/12 [phone]

## 2012-12-20 IMAGING — XA IR DG VERTEBROPLASTY FL
1 series · 14 of 24 positions shown · IV contrast (IODINE)
Comparison: MRI of the lumbosacral spine of 11/26/2011.

CLINICAL DATA: Severe sacral pain secondary to sacral
insufficiency fractures.

SACROPLASTY

[Series 300: spine · 14 of 33 slices shown]
[im 1/33]
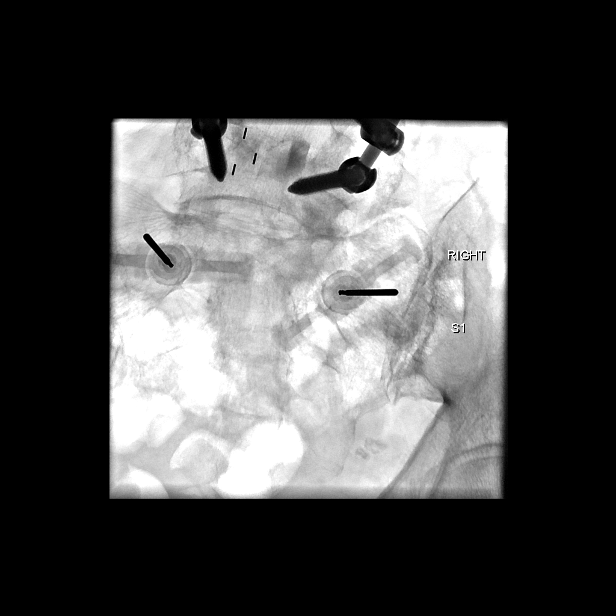
[im 3/33]
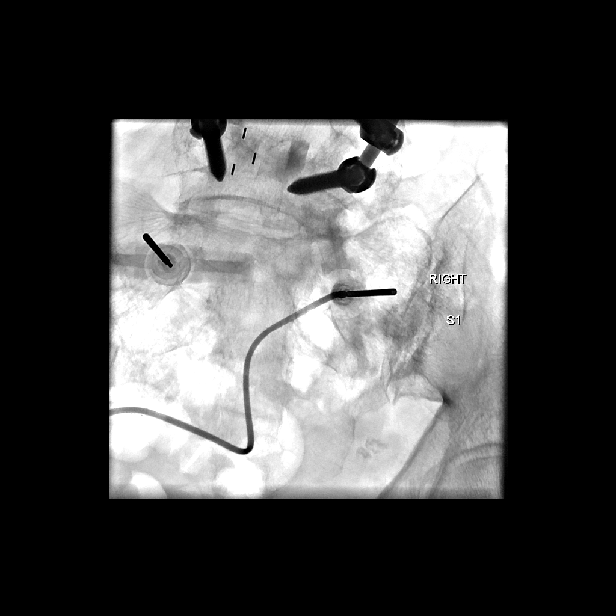
[im 6/33]
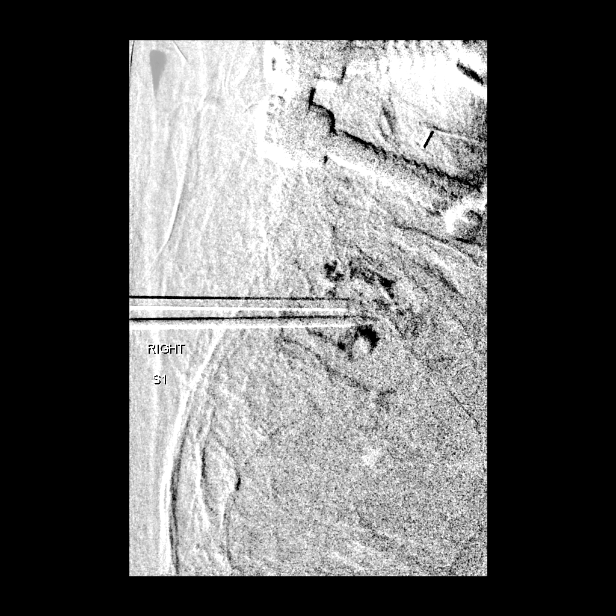
[im 9/33]
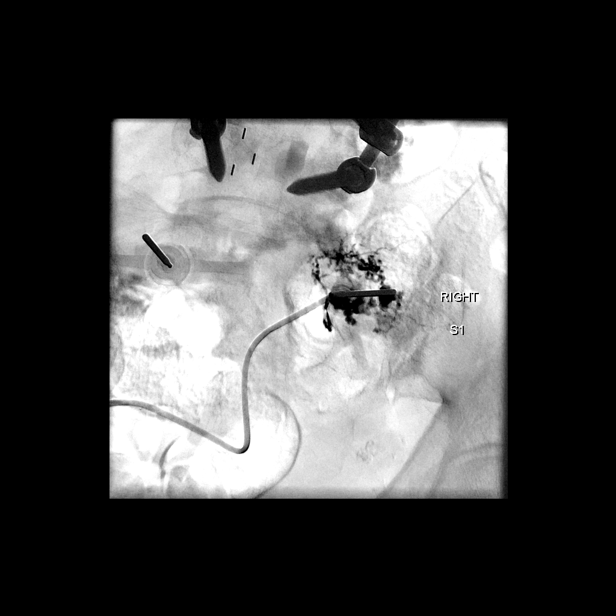
[im 10/33]
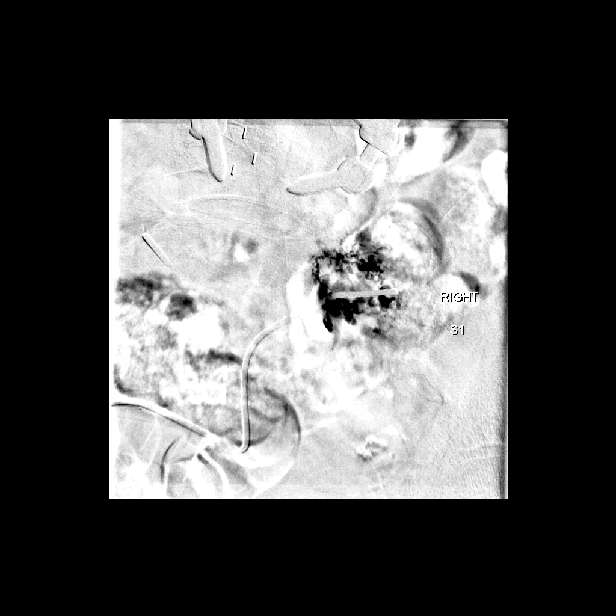
[im 13/33]
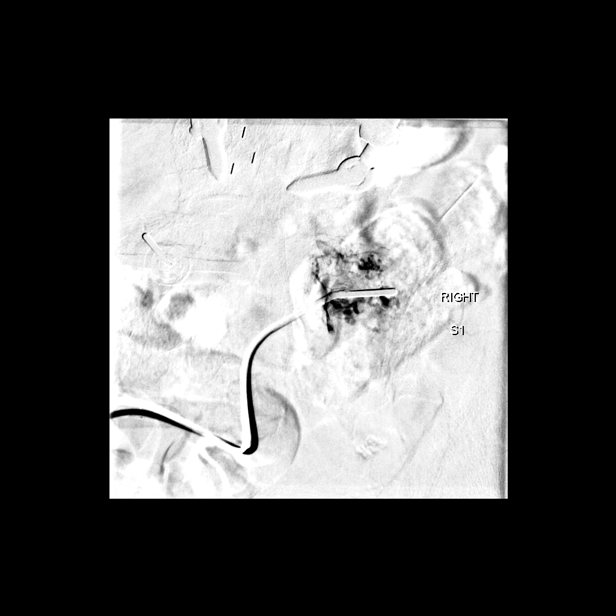
[im 16/33]
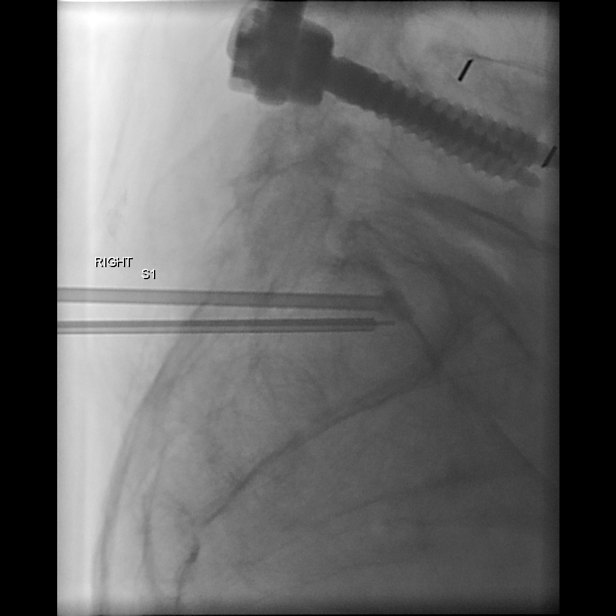
[im 17/33]
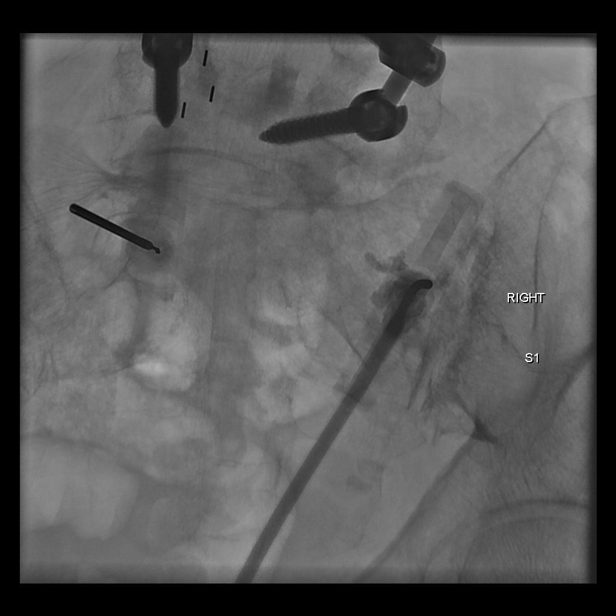
[im 20/33]
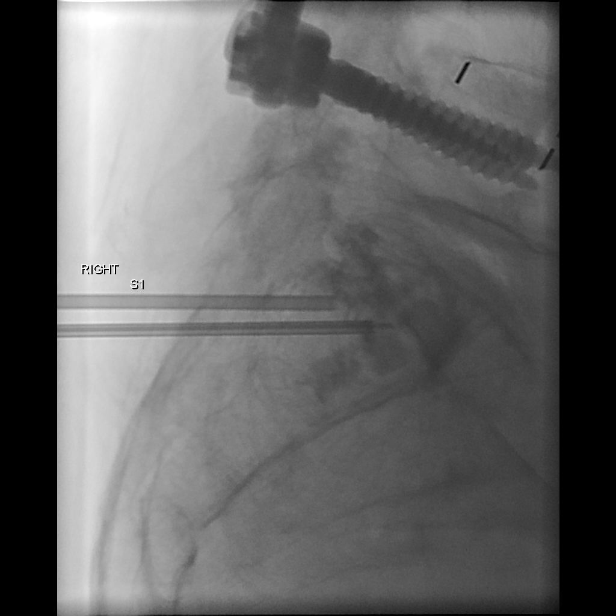
[im 23/33]
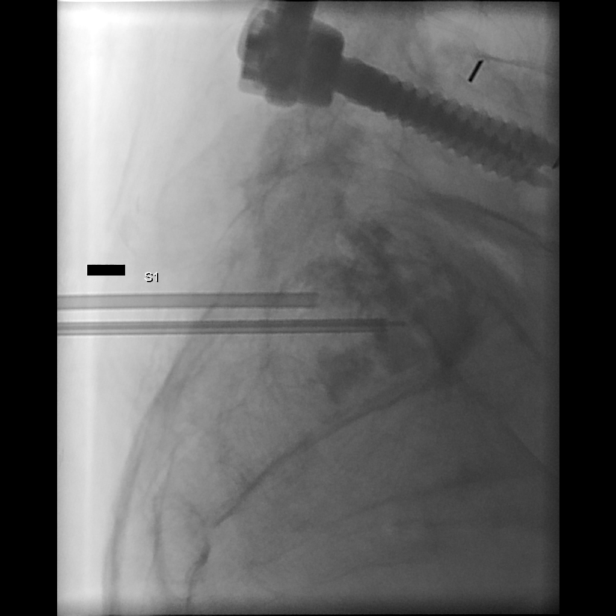
[im 26/33]
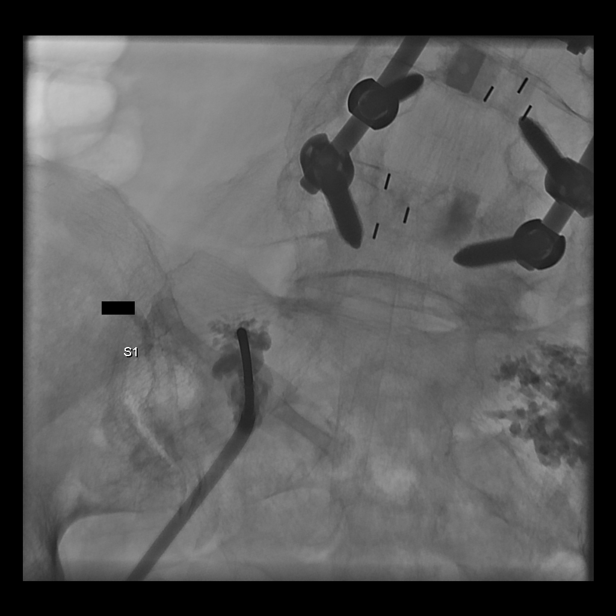
[im 27/33]
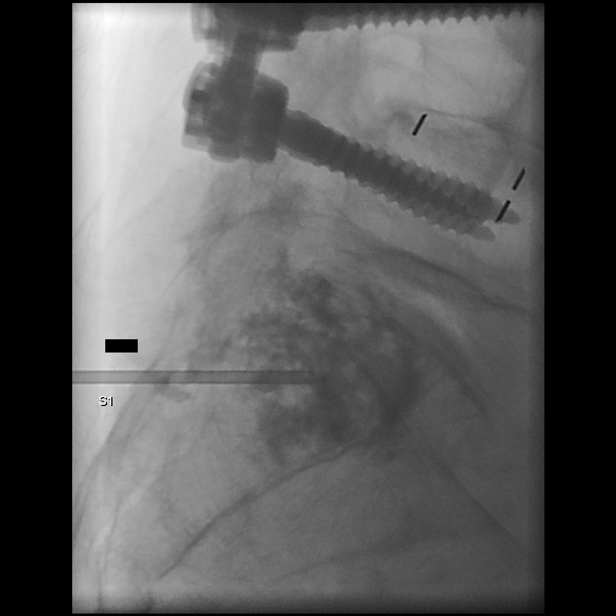
[im 30/33]
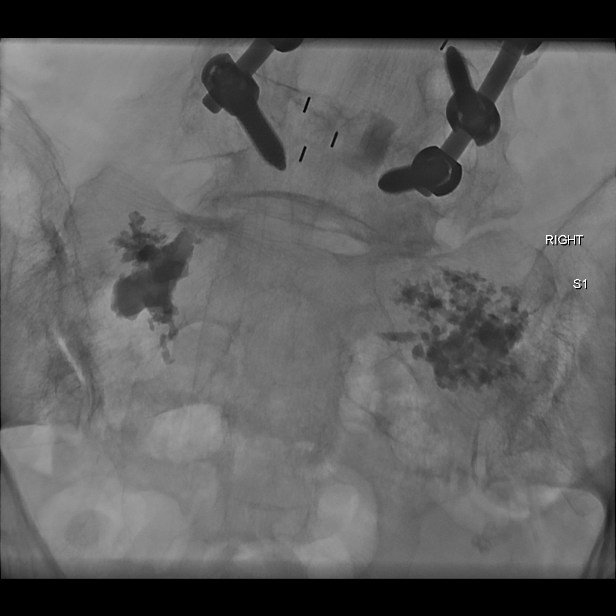
[im 33/33]
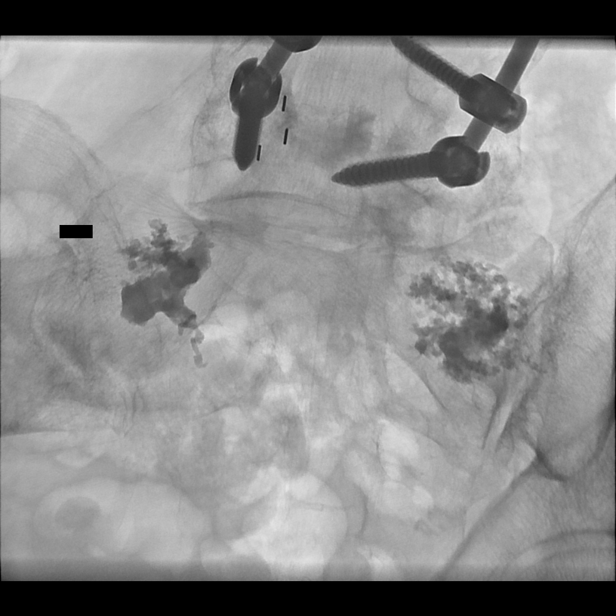

[14 of 24 positions shown; findings below may reference images not displayed]

Following a full explanation of the procedure along with the
potential associated complications, an informed witnessed consent
was obtained.

The patient was placed prone on the fluoroscopic table.

The skin overlying the lumbosacral region was then prepped and
draped in the usual sterile fashion.

The skin entry sites on either side of the midline of the S1
segment was then infiltrated with 0.25% bupivacaine and extended to
the posterior aspect of the bone.

Using biplane intermittent fluoroscopy, 13-gauge Deisy spine needles
were then advanced into the anterior third of the ala of the sacrum
bilaterally.

A gentle contrast injection demonstrated a trabecular pattern of
contrast enhancement with early opacification of presacral veins.

This necessitated the injection of Gelfoam pledgets through the
needles prior to the delivery of the methylmethacrylate mixture.

Methylmethacrylate mixture was then reconstituted with Tobramycin
in the Saheva delivery device system.

Using biplane intermittent fluoroscopy via microtubing connected to
the needles, methylmethacrylate mixture was then injected into the
sacrum using the Saheva delivery device system.  Excellent filling
was obtained bilaterally.  No extravasation of the
methylmethacrylate mixture was seen into the presacral space, or
the adjacent veins.

The osteo drills were then removed.  Hemostasis was achieved at the
skin entry sites.

The patient tolerated the procedure well.  There were no acute
complications.

Medications utilized: Versed 2 mg IV.  Fentanyl 50 mcg IV.

IMPRESSION
1.  Status post fluoroscopic-guided needle placement for vertebral
body augmentation for sacral insufficiency fractures at S1 using
the vertebroplasty technique.

## 2014-01-12 ENCOUNTER — Encounter (HOSPITAL_COMMUNITY): Payer: Self-pay | Admitting: Internal Medicine

## 2014-01-12 ENCOUNTER — Inpatient Hospital Stay (HOSPITAL_COMMUNITY)
Admission: AD | Admit: 2014-01-12 | Discharge: 2014-01-16 | DRG: 918 | Disposition: A | Discharge status: Skilled Nursing Facility | Payer: Medicare Other | Source: Other Acute Inpatient Hospital | Attending: Internal Medicine | Admitting: Internal Medicine

## 2014-01-12 DIAGNOSIS — F329 Major depressive disorder, single episode, unspecified: Secondary | ICD-10-CM | POA: Diagnosis present

## 2014-01-12 DIAGNOSIS — R197 Diarrhea, unspecified: Secondary | ICD-10-CM | POA: Diagnosis present

## 2014-01-12 DIAGNOSIS — F419 Anxiety disorder, unspecified: Secondary | ICD-10-CM | POA: Diagnosis present

## 2014-01-12 DIAGNOSIS — I509 Heart failure, unspecified: Secondary | ICD-10-CM | POA: Diagnosis present

## 2014-01-12 DIAGNOSIS — T45515A Adverse effect of anticoagulants, initial encounter: Principal | ICD-10-CM | POA: Diagnosis present

## 2014-01-12 DIAGNOSIS — R9431 Abnormal electrocardiogram [ECG] [EKG]: Secondary | ICD-10-CM | POA: Diagnosis present

## 2014-01-12 DIAGNOSIS — I10 Essential (primary) hypertension: Secondary | ICD-10-CM | POA: Diagnosis present

## 2014-01-12 DIAGNOSIS — Z8673 Personal history of transient ischemic attack (TIA), and cerebral infarction without residual deficits: Secondary | ICD-10-CM | POA: Diagnosis not present

## 2014-01-12 DIAGNOSIS — H409 Unspecified glaucoma: Secondary | ICD-10-CM | POA: Diagnosis present

## 2014-01-12 DIAGNOSIS — H543 Unqualified visual loss, both eyes: Secondary | ICD-10-CM | POA: Diagnosis present

## 2014-01-12 DIAGNOSIS — M549 Dorsalgia, unspecified: Secondary | ICD-10-CM | POA: Diagnosis present

## 2014-01-12 DIAGNOSIS — Z882 Allergy status to sulfonamides status: Secondary | ICD-10-CM | POA: Diagnosis not present

## 2014-01-12 DIAGNOSIS — K449 Diaphragmatic hernia without obstruction or gangrene: Secondary | ICD-10-CM | POA: Diagnosis present

## 2014-01-12 DIAGNOSIS — H540 Blindness, both eyes: Secondary | ICD-10-CM

## 2014-01-12 DIAGNOSIS — D62 Acute posthemorrhagic anemia: Secondary | ICD-10-CM | POA: Diagnosis present

## 2014-01-12 DIAGNOSIS — K922 Gastrointestinal hemorrhage, unspecified: Secondary | ICD-10-CM | POA: Diagnosis present

## 2014-01-12 DIAGNOSIS — Z91041 Radiographic dye allergy status: Secondary | ICD-10-CM

## 2014-01-12 DIAGNOSIS — Z7982 Long term (current) use of aspirin: Secondary | ICD-10-CM

## 2014-01-12 DIAGNOSIS — Z7901 Long term (current) use of anticoagulants: Secondary | ICD-10-CM

## 2014-01-12 DIAGNOSIS — I4891 Unspecified atrial fibrillation: Secondary | ICD-10-CM | POA: Diagnosis present

## 2014-01-12 DIAGNOSIS — G8929 Other chronic pain: Secondary | ICD-10-CM | POA: Diagnosis present

## 2014-01-12 DIAGNOSIS — R112 Nausea with vomiting, unspecified: Secondary | ICD-10-CM | POA: Diagnosis present

## 2014-01-12 DIAGNOSIS — I481 Persistent atrial fibrillation: Secondary | ICD-10-CM

## 2014-01-12 DIAGNOSIS — H548 Legal blindness, as defined in USA: Secondary | ICD-10-CM | POA: Diagnosis present

## 2014-01-12 DIAGNOSIS — E876 Hypokalemia: Secondary | ICD-10-CM | POA: Diagnosis not present

## 2014-01-12 DIAGNOSIS — Z79899 Other long term (current) drug therapy: Secondary | ICD-10-CM | POA: Diagnosis not present

## 2014-01-12 DIAGNOSIS — E785 Hyperlipidemia, unspecified: Secondary | ICD-10-CM | POA: Diagnosis present

## 2014-01-12 DIAGNOSIS — R791 Abnormal coagulation profile: Secondary | ICD-10-CM | POA: Diagnosis present

## 2014-01-12 DIAGNOSIS — K921 Melena: Secondary | ICD-10-CM

## 2014-01-12 HISTORY — DX: Other complications of anesthesia, initial encounter: T88.59XA

## 2014-01-12 HISTORY — DX: Adverse effect of unspecified anesthetic, initial encounter: T41.45XA

## 2014-01-12 LAB — CBC
HCT: 27.8 % — ABNORMAL LOW (ref 36.0–46.0)
HEMOGLOBIN: 8.4 g/dL — AB (ref 12.0–15.0)
MCH: 24.4 pg — ABNORMAL LOW (ref 26.0–34.0)
MCHC: 30.2 g/dL (ref 30.0–36.0)
MCV: 80.8 fL (ref 78.0–100.0)
Platelets: 196 10*3/uL (ref 150–400)
RBC: 3.44 MIL/uL — ABNORMAL LOW (ref 3.87–5.11)
RDW: 16.6 % — ABNORMAL HIGH (ref 11.5–15.5)
WBC: 5.4 10*3/uL (ref 4.0–10.5)

## 2014-01-12 MED ORDER — ACETAMINOPHEN 325 MG PO TABS: 650.0000 mg | ORAL_TABLET | Freq: Four times a day (QID) | ORAL | Status: DC | PRN

## 2014-01-12 MED ORDER — SODIUM CHLORIDE 0.9 % IJ SOLN
3.0000 mL | Freq: Two times a day (BID) | INTRAMUSCULAR | Status: DC
  Administered 2014-01-13 – 2014-01-15 (×2): 3 mL via INTRAVENOUS

## 2014-01-12 MED ORDER — ONDANSETRON HCL 4 MG/2ML IJ SOLN
4.0000 mg | Freq: Three times a day (TID) | INTRAMUSCULAR | Status: DC | PRN
  Administered 2014-01-14 – 2014-01-15 (×2): 4 mg via INTRAVENOUS
  Filled 2014-01-12 (×2): qty 2

## 2014-01-12 MED ORDER — HYPROMELLOSE (GONIOSCOPIC) 2.5 % OP SOLN: 1.0000 [drp] | Freq: Four times a day (QID) | OPHTHALMIC | Status: DC | PRN

## 2014-01-12 MED ORDER — OXYCODONE HCL ER 10 MG PO T12A
10.0000 mg | EXTENDED_RELEASE_TABLET | Freq: Three times a day (TID) | ORAL | Status: DC
  Administered 2014-01-13 – 2014-01-16 (×10): 10 mg via ORAL
  Filled 2014-01-12 (×13): qty 1

## 2014-01-12 MED ORDER — TIMOLOL MALEATE 0.5 % OP SOLN
1.0000 [drp] | Freq: Two times a day (BID) | OPHTHALMIC | Status: DC
  Administered 2014-01-13 – 2014-01-16 (×8): 1 [drp] via OPHTHALMIC
  Filled 2014-01-12: qty 5

## 2014-01-12 MED ORDER — SODIUM CHLORIDE 0.9 % IV SOLN
INTRAVENOUS | Status: DC
  Administered 2014-01-13 – 2014-01-14 (×2): via INTRAVENOUS

## 2014-01-12 MED ORDER — SIMVASTATIN 40 MG PO TABS
40.0000 mg | ORAL_TABLET | Freq: Every evening | ORAL | Status: DC
  Administered 2014-01-13 – 2014-01-14 (×2): 40 mg via ORAL
  Filled 2014-01-12 (×3): qty 1

## 2014-01-12 MED ORDER — PANTOPRAZOLE SODIUM 40 MG IV SOLR
40.0000 mg | Freq: Two times a day (BID) | INTRAVENOUS | Status: DC
  Administered 2014-01-13 (×2): 40 mg via INTRAVENOUS
  Filled 2014-01-12 (×3): qty 40

## 2014-01-12 MED ORDER — ACETAMINOPHEN 650 MG RE SUPP: 650.0000 mg | Freq: Four times a day (QID) | RECTAL | Status: DC | PRN

## 2014-01-12 MED ORDER — POLYVINYL ALCOHOL 1.4 % OP SOLN
1.0000 [drp] | Freq: Four times a day (QID) | OPHTHALMIC | Status: DC | PRN
  Administered 2014-01-14: 1 [drp] via OPHTHALMIC
  Filled 2014-01-12: qty 15

## 2014-01-12 NOTE — Progress Notes (Signed)
Pt admitted to the unit. Pt is stable, alert and oriented per baseline. Oriented to call bell. Educated to call for any assistance. Bed in lowest position, call bell within reach- will continue to monitor.

## 2014-01-12 NOTE — Plan of Care (Signed)
Problem: Phase I Progression Outcomes Goal: Pain controlled with appropriate interventions Outcome: Completed/Met Date Met:  01/12/14

## 2014-01-12 NOTE — H&P (Signed)
[facility] History and Physical  Janice RmDaisy G Minnich YNW:[id number] DOB: 09/15/1927 DOA: 01/12/2014  Referring physician: ED physician PCP: Temple PaciniPOOL,HENRY A, MD  Specialists:   Chief Complaint: Nausea, vomiting, diarrhea, GI bleeding, and supratherapeutic INR.  HPI: Janice Barrett is a 80 y.o. female with the past medical history of A. Fib on Coumadin, history of stroke, CHF,  blindness, hyperlipidemia, depression, anxiety, who presented with nausea, vomiting, diarrhea, GI bleeding, and supratherapeutic INR.  Patient reports that in the past 4 weeks, she has been having nausea, vomiting and diarrhea. She vomited occasionally with food materials. She denies any abdominal pain. She has mild diarrhea with 1-2 bowel movements with loose stools each day. Because of blindness, she does not know whether there is blood in the stools. She has generalized weakness. She does not have shortness of breath, chest pain, dizziness, palpitation. No fever, chills, weakness, numbness or tingling sensations in her extremities or leg edema.  [facility] She was noticed to have bloody stool there. Her INR was higher than 10 and received 10 mg of Vk. Her hemoglobin decreased from 11.3 on 12/2012 to 9.6. Troponin was negative. Patient's urinalysis is positive for leukocyte esterase, but denies any symptoms for UTI. Patient's EKG showed left bundle blockage, but no previous EKG to compare with. Patient does not have any chest pain.  Patient was transferred  [facility] to us because of no GI doctor there. She is admitted to inpatient for further evaluation and treatment.   Review of Systems: As presented in the history of presenting illness, rest negative.  Where does patient live? At home with her husband  Can patient participate in ADLs? Barely  Allergy:  Allergies  Allergen Reactions  . Septra [Sulfamethoxazole-Trimethoprim] Other (See Comments)    Severe hives     Past Medical History  Diagnosis Date  . Back pain, chronic   . Blind     Past Surgical History  Procedure Laterality Date  . Back surgery      Social History:  reports that she has never smoked. She does not have any smokeless tobacco history on file. She reports that she does not drink alcohol or use illicit drugs.  Family History:  Family History  Problem Relation Age of Onset  . Stomach cancer Father   . Heart attack Brother      Prior to Admission medications   Medication Sig Start Date End Date Taking? Authorizing Provider  aspirin 81 MG tablet Take 81 mg by mouth daily.    Historical Provider, MD  hydroxypropyl methylcellulose (ISOPTO TEARS) 2.5 % ophthalmic solution Place 1 drop into both eyes 4 (four) times daily as needed (dry eyes).     Historical Provider, MD  lisinopril (PRINIVIL,ZESTRIL) 20 MG tablet Take 20 mg by mouth at bedtime.     Historical Provider, MD  oxyCODONE (OXYCONTIN) 10 MG 12 hr tablet Take 10 mg by mouth 3 (three) times daily.    Historical Provider, MD  simvastatin (ZOCOR) 40 MG tablet Take 40 mg by mouth every evening.    Historical Provider, MD  timolol (TIMOPTIC) 0.5 % ophthalmic solution Place 1 drop into the right eye 2 (two) times daily.    Historical Provider, MD  warfarin (COUMADIN) 5 MG tablet Take 5-7.5 mg by mouth daily. 5 mg daily except 7.5 mg on wednesdays and sundays    Historical Provider, MD    Physical Exam: Filed Vitals:   01/12/14 2143 01/12/14 2145 01/13/14 [id number]  BP:  138/44   Pulse:  84   Temp:  97.5 F (36.4 C)   TempSrc:  Oral   Resp:  24   Height: 5\' 4"  (1.626 m)    Weight: 76.658 kg (169 lb)  76.658 kg (169 lb)  SpO2:  92%    General: Not in acute distress HEENT:       Eyes: blinded, no scleral icterus       ENT: No discharge from the ears and nose, no pharynx injection, no tonsillar enlargement.        Neck: No JVD, no bruit, no mass felt. Cardiac: S1/S2, RRR, 2/6 systolic murmurs, No gallops or  rubs Pulm: Good air movement bilaterally. Clear to auscultation bilaterally. No rales, wheezing, rhonchi or rubs. Abd: Soft, nondistended, nontender, no rebound pain, no organomegaly, BS present Ext: No edema bilaterally. 2+DP/PT pulse bilaterally Musculoskeletal: No joint deformities, erythema, or stiffness, ROM full Skin: No rashes.  Neuro: Alert and oriented X3, cranial nerves II-XII grossly intact, muscle strength 5/5 in all extremeties, sensation to light touch intact. Moves all extremities.   Labs on Admission:  Basic Metabolic Panel: No results for input(s): NA, K, CL, CO2, GLUCOSE, BUN, CREATININE, CALCIUM, MG, PHOS in the last 168 hours. Liver Function Tests: No results for input(s): AST, ALT, ALKPHOS, BILITOT, PROT, ALBUMIN in the last 168 hours. No results for input(s): LIPASE, AMYLASE in the last 168 hours. No results for input(s): AMMONIA in the last 168 hours. CBC:  Recent Labs Lab 01/12/14 2246  WBC 5.4  HGB 8.4*  HCT 27.8*  MCV 80.8  PLT 196   Cardiac Enzymes:  Recent Labs Lab 01/13/14 0033  TROPONINI <0.30    BNP (last 3 results) No results for input(s): PROBNP in the last 8760 hours. CBG: No results for input(s): GLUCAP in the last 168 hours.  Radiological Exams on Admission: No results found.  EKG: done in [facility], showed LBBB.   Assessment/Plan Principal Problem:   GI bleeding Active Problems:   Supratherapeutic INR   Congestive heart failure   Essential hypertension   Hyperlipidemia   Blindness of both eyes   Atrial fibrillation   History of stroke   Nausea vomiting and diarrhea  GIB: it is most likely due to supratherapeutic INR secondary to Coumadin use. Patient had remote colonoscopy which was normal per patient. His hemoglobin dropped from 11.3 on 12/2012 to 9.6. It is not very sure whether this is acute decrease of hemoglobin or because of gradual change. Currently patient is hemodynamically stable. No tachycardia.  - will  admit to tele bed - hold coumadin - cbc q6h,  - type and screen, transfuse when necessary - NPO - gentle IVF: NS 50 cc/h given hx of CHF - Protonix 40 mg bid - check lactic acid  Nausea, vomiting and diarrhea: Unclear etiology. Gastroenteritis is potential differential diagnosis. patient did not have antibiotics use recently. - will get GI pathogen panel - Check C diff by PCR - stool culture - Zofran for nausea - on Protonix as above   supratherapeutic INR: INR>10. -received Vk 10 mg in [facility]. - hold coumadin - repeat INR in AM  Afib: heart rate is well controlled. -hold Coumadin  CHF: not sure it is diastolic or systolic congestive heart failure. Patient is euvolemic on admission. She does not have any leg edema. - hold Lasix. - Hold aspirin and amlodipine due to GI bleeding  Hx of stroke: no deficit. stable - Hold aspirin due to GI  bleeding - continue home with Zocor  Abnormal EKG: EKG showed left bundle blockage, but no previous EKG to compare with. Pt does not have chest pain. - trop x 3 - repeat EKG   DVT ppx: SCD Code Status: limited, No intubation, but OK with CPR Family Communication:  Yes, patient's   son    at bed side Disposition Plan: Admit to inpatient   Date of Service 01/13/2014    Lorretta HarpIU, Kadiatou Oplinger [facility] Pager [phone]  If 7PM-7AM, please contact night-coverage [email or web address] Password [email or web address] 01/13/2014, 4:51 AM

## 2014-01-13 DIAGNOSIS — K922 Gastrointestinal hemorrhage, unspecified: Secondary | ICD-10-CM

## 2014-01-13 DIAGNOSIS — R112 Nausea with vomiting, unspecified: Secondary | ICD-10-CM | POA: Diagnosis present

## 2014-01-13 DIAGNOSIS — I482 Chronic atrial fibrillation: Secondary | ICD-10-CM

## 2014-01-13 DIAGNOSIS — R197 Diarrhea, unspecified: Secondary | ICD-10-CM

## 2014-01-13 DIAGNOSIS — R9431 Abnormal electrocardiogram [ECG] [EKG]: Secondary | ICD-10-CM | POA: Diagnosis present

## 2014-01-13 LAB — COMPREHENSIVE METABOLIC PANEL
ALT: 8 U/L (ref 0–35)
AST: 22 U/L (ref 0–37)
Albumin: 2.8 g/dL — ABNORMAL LOW (ref 3.5–5.2)
Alkaline Phosphatase: 62 U/L (ref 39–117)
Anion gap: 13 (ref 5–15)
BUN: 10 mg/dL (ref 6–23)
CALCIUM: 8.4 mg/dL (ref 8.4–10.5)
CO2: 29 meq/L (ref 19–32)
Chloride: 101 mEq/L (ref 96–112)
Creatinine, Ser: 0.92 mg/dL (ref 0.50–1.10)
GFR calc Af Amer: 64 mL/min — ABNORMAL LOW (ref >90)
GFR, EST NON AFRICAN AMERICAN: 56 mL/min — AB (ref >90)
Glucose, Bld: 95 mg/dL (ref 70–99)
Potassium: 3.3 mEq/L — ABNORMAL LOW (ref 3.7–5.3)
Sodium: 143 mEq/L (ref 137–147)
Total Bilirubin: 0.9 mg/dL (ref 0.3–1.2)
Total Protein: 6 g/dL (ref 6.0–8.3)

## 2014-01-13 LAB — CBC
HCT: 26.2 % — ABNORMAL LOW (ref 36.0–46.0)
HCT: 29.3 % — ABNORMAL LOW (ref 36.0–46.0)
HEMATOCRIT: 30.1 % — AB (ref 36.0–46.0)
HEMOGLOBIN: 9.4 g/dL — AB (ref 12.0–15.0)
Hemoglobin: 8 g/dL — ABNORMAL LOW (ref 12.0–15.0)
Hemoglobin: 8.8 g/dL — ABNORMAL LOW (ref 12.0–15.0)
MCH: 24.5 pg — ABNORMAL LOW (ref 26.0–34.0)
MCH: 25.5 pg — ABNORMAL LOW (ref 26.0–34.0)
MCH: 26 pg (ref 26.0–34.0)
MCHC: 30 g/dL (ref 30.0–36.0)
MCHC: 30.5 g/dL (ref 30.0–36.0)
MCHC: 31.2 g/dL (ref 30.0–36.0)
MCV: 81.6 fL (ref 78.0–100.0)
MCV: 83.4 fL (ref 78.0–100.0)
MCV: 83.4 fL (ref 78.0–100.0)
PLATELETS: 206 10*3/uL (ref 150–400)
Platelets: 175 10*3/uL (ref 150–400)
Platelets: 191 10*3/uL (ref 150–400)
RBC: 3.14 MIL/uL — ABNORMAL LOW (ref 3.87–5.11)
RBC: 3.59 MIL/uL — AB (ref 3.87–5.11)
RBC: 3.61 MIL/uL — AB (ref 3.87–5.11)
RDW: 16.9 % — ABNORMAL HIGH (ref 11.5–15.5)
RDW: 16.7 % — ABNORMAL HIGH (ref 11.5–15.5)
RDW: 16.7 % — AB (ref 11.5–15.5)
WBC: 5 10*3/uL (ref 4.0–10.5)
WBC: 5 10*3/uL (ref 4.0–10.5)
WBC: 5.5 10*3/uL (ref 4.0–10.5)

## 2014-01-13 LAB — PROTIME-INR
INR: 6.39 — AB (ref 0.00–1.49)
Prothrombin Time: 56.6 seconds — ABNORMAL HIGH (ref 11.6–15.2)

## 2014-01-13 LAB — TROPONIN I
Troponin I: <0.3 ng/mL (ref <0.30)
Troponin I: <0.3 ng/mL (ref <0.30)

## 2014-01-13 LAB — LACTIC ACID, PLASMA: Lactic Acid, Venous: 0.7 mmol/L (ref 0.5–2.2)

## 2014-01-13 LAB — LIPASE, BLOOD: Lipase: 14 U/L (ref 11–59)

## 2014-01-13 MED ORDER — PANTOPRAZOLE SODIUM 40 MG PO TBEC
40.0000 mg | DELAYED_RELEASE_TABLET | Freq: Every day | ORAL | Status: DC
  Administered 2014-01-14 – 2014-01-16 (×3): 40 mg via ORAL
  Filled 2014-01-13 (×3): qty 1

## 2014-01-13 MED ORDER — BOOST / RESOURCE BREEZE PO LIQD
1.0000 | Freq: Three times a day (TID) | ORAL | Status: DC
  Administered 2014-01-13 – 2014-01-15 (×4): 1 via ORAL

## 2014-01-13 MED ORDER — SODIUM CHLORIDE 0.9 % IV SOLN
Freq: Once | INTRAVENOUS | Status: AC
Start: 2014-01-13 — End: 2014-01-13
  Administered 2014-01-13: 13:00:00 via INTRAVENOUS

## 2014-01-13 MED ORDER — PHYTONADIONE 5 MG PO TABS
5.0000 mg | ORAL_TABLET | Freq: Once | ORAL | Status: AC
  Administered 2014-01-13: 5 mg via ORAL
  Filled 2014-01-13: qty 1

## 2014-01-13 NOTE — Progress Notes (Signed)
[facility] PROGRESS NOTE  Janice Barrett GNF:[id number] DOB: 06/26/1927 DOA: 01/12/2014 PCP: Temple PaciniPOOL,HENRY A, MD  Assessment/Plan: GIB: it is most likely due to supratherapeutic INR secondary to Coumadin use. Patient had remote colonoscopy which was normal per patient. Her hemoglobin dropped from 11.3 on 12/2012 to 9.4. Currently patient is hemodynamically stable. No tachycardia. - will continue monitoring her on tele bed - hold coumadin - cbc q12h - type and screen, transfuse when necessary - NPO for now - continue gentle IVF: NS 50 cc/h given hx of CHF - Protonix IV 40 mg bid - lactic acid normal -GI consulted -[facility]  Nausea, vomiting and diarrhea: Unclear etiology. Gastroenteritis is potential differential diagnosis. patient did not have antibiotics use recently. - will follow GI pathogen panel - Check C diff by PCR - stool culture - PRN Zofran for nausea/vomiting - on Protonix as above  supratherapeutic INR: INR>10. -received Vk 10 [facility]. - hold coumadin - repeated INR today demonstrated 6.4 -will transfuse in unit of FFP -give another 5mg  of vit K -follow trend  Afib: heart rate is well controlled. -hold Coumadin due to supratherapeutic  CHF: not sure it is diastolic or systolic congestive heart failure. Patient is euvolemic on admission. She does not have any leg edema. - hold Lasix. - Hold aspirin and amlodipine due to GI bleeding  Hx of stroke: no deficit. stable - Hold aspirin and coumadin due to GI bleeding - continue Zocor  HLD -continue statins  Abnormal EKG: EKG showed left bundle blockage, but no previous EKG to compare with. Pt does not have chest pain. - trop x 3 - repeated EKG w/o acute ischemic changes  Code Status: Partial Code (no intubation) Family Communication: son and husband at bedside Disposition Plan: to be determine    Consultants:  GI   Procedures:  None   Antibiotics:  None    HPI/Subjective: Tire and fatigue, no CP or palpitations. Patient denies N/V or abd pain  Objective: Filed Vitals:   01/13/14 1310  BP: 143/51  Pulse: 86  Temp: 98.7 F (37.1 C)  Resp: 24   No intake or output data in the 24 hours ending 01/13/14 1412 Filed Weights   01/12/14 2143 01/13/14 0420  Weight: 76.658 kg (169 lb) 76.658 kg (169 lb)    Exam:   General:  Afebrile, denies CP, abd pain or N/V  Cardiovascular: controlled rate, positive SEM, no rubs or gallops  Respiratory: CTA bilaterally  Abdomen: soft, NT, ND, positive BS  Musculoskeletal: no edema or cyanosis   Data Reviewed: Basic Metabolic Panel:  Recent Labs Lab 01/13/14 0540  NA 143  K 3.3*  CL 101  CO2 29  GLUCOSE 95  BUN 10  CREATININE 0.92  CALCIUM 8.4   Liver Function Tests:  Recent Labs Lab 01/13/14 0540  AST 22  ALT 8  ALKPHOS 62  BILITOT 0.9  PROT 6.0  ALBUMIN 2.8*    Recent Labs Lab 01/13/14 1051  LIPASE 14   CBC:  Recent Labs Lab 01/12/14 2246 01/13/14 0540 01/13/14 1051  WBC 5.4 5.0 5.0  HGB 8.4* 8.8* 9.4*  HCT 27.8* 29.3* 30.1*  MCV 80.8 81.6 83.4  PLT 196 206 191   Cardiac Enzymes:  Recent Labs Lab 01/13/14 0033 01/13/14 0540 01/13/14 1057  TROPONINI <0.30 <0.30 <0.30    Studies: No results found.  Scheduled Meds: . OxyCODONE  10 mg Oral TID  . pantoprazole (PROTONIX) IV  40 mg Intravenous Q12H  .  simvastatin  40 mg Oral QPM  . sodium chloride  3 mL Intravenous Q12H  . timolol  1 drop Right Eye BID   Continuous Infusions: . sodium chloride 50 mL/hr at 01/13/14 0008    Principal Problem:   GI bleeding Active Problems:   Supratherapeutic INR   Congestive heart failure   Essential hypertension   Hyperlipidemia   Blindness of both eyes   Atrial fibrillation   History of stroke   Nausea vomiting and diarrhea   Abnormal EKG    Time spent: 30 minutes (50% of the time dedicated to face to face examination; explanation of work up to  family and coordinating her care)     Vassie LollMadera, Vint Pola  [facility] Pager [phone]. If 7PM-7AM, please contact night-coverage at [email or web address], password [email or web address] 01/13/2014, 2:12 PM  LOS: 1 day

## 2014-01-13 NOTE — Consult Note (Signed)
[facility] Consult: 2:11 PM 01/13/2014  LOS: 1 day    Referring Provider: Dr. Alferd PateeMadeira Primary Care Physician:  Tommas OlpPeggy Brewer [place] [facility] [place], Dr Lou MinerHuffman,. Primary Gastroenterologist: unassigned patient was transported here today [facility]     Reason for Consultation:  Anemia with blood testing positive for FOBT.   HPI: Janice Barrett is a 80 y.o. female.  She has a history of A. Fib. Takes chronic Coumadin. History CVA, CHF. She is blind  [facility]. Gave history of 4 weeks of nausea, vomiting, diarrhea. She is only having 1-2 loose bowel movements a day. Because of her blindness she is unable to tell us whether there is been blood in the stools or whether they've been melenic. The emesis has not been bloody but she can't tell us whether it's been dark or coffee ground in appearance. She denies dyspnea, dizziness, chest pain and palpitations. [facility] her INR was more than 10 and she was treated with 10 mg of IV vitamin K. A year ago in November 2014 her hemoglobin was 11.3. [facility] today it was 9.6, down to a [facility] Her MCV is normal. Cardiac troponin was negative EKG showed a left bundle branch block without acute changes. As there was no [facility] GI [facility] she was transferred to [facility].  Patient doesn't drink alcohol, doesn't use NSAIDs. She has had colonoscopy remotely at least 10 years ago. There were no history of polyps or diverticulosis. She has never had problems with reflux disease or ulcers. She has never undergone upper endoscopy. Patient doesn't experience reflux symptoms, dysphagia, pyrosis.     Past Medical History  Diagnosis Date  . Back pain, chronic   . Blind     Past  Surgical History  Procedure Laterality Date  . Back surgery      Prior to Admission medications   Medication Sig Start Date End Date Taking? Authorizing Provider  aspirin 81 MG tablet Take 81 mg by mouth daily.   Yes Historical Provider, MD  hydroxypropyl methylcellulose (ISOPTO TEARS) 2.5 % ophthalmic solution Place 1 drop into both eyes 4 (four) times daily as needed (dry eyes).    Yes Historical Provider, MD  lisinopril (PRINIVIL,ZESTRIL) 20 MG tablet Take 20 mg by mouth at bedtime.    Yes Historical Provider, MD  simvastatin (ZOCOR) 40 MG tablet Take 40 mg by mouth every evening.   Yes Historical Provider, MD  timolol (TIMOPTIC) 0.5 % ophthalmic solution Place 1 drop into the right eye 2 (two) times daily.   Yes Historical Provider, MD  warfarin (COUMADIN) 5 MG tablet Take 5-7.5 mg by mouth daily. 5 mg daily except 7.5 mg on wednesdays and sundays   Yes Historical Provider, MD  oxyCODONE (OXYCONTIN) 10 MG 12 hr tablet Take 10 mg by mouth 3 (three) times daily.    Historical Provider, MD    Scheduled Meds: . OxyCODONE  10 mg Oral TID  . pantoprazole (PROTONIX) IV  40  mg Intravenous Q12H  . simvastatin  40 mg Oral QPM  . sodium chloride  3 mL Intravenous Q12H  . timolol  1 drop Right Eye BID   Infusions: . sodium chloride 50 mL/hr at 01/13/14 0008   PRN Meds: acetaminophen **OR** acetaminophen, ondansetron, polyvinyl alcohol   Allergies as of 01/12/2014 - Review Complete 01/12/2014  Allergen Reaction Noted  . Septra [sulfamethoxazole-trimethoprim] Other (See Comments) 01/14/2012    Family History  Problem Relation Age of Onset  . Stomach cancer Father   . Heart attack Brother     History   Social History  . Marital Status: Married    Spouse Name: N/A    Number of Children: N/A  . Years of Education: N/A   Occupational History  . Not on file.   Social History Main Topics  . Smoking status: Never Smoker   . Smokeless tobacco: Not on file  . Alcohol Use: No  .  Drug Use: No  . Sexual Activity: Not on file   Other Topics Concern  . Not on file   Social History Narrative    REVIEW OF SYSTEMS: Constitutional:  Patient has lost 20 pounds within the last 4-6 weeks. ENT:  No nose bleeds. No congestion.  Her mouth has become very dry and has affected the quality of her voice which is quite hoarse Pulm:  No dyspnea, no pleuritic chest pain, no cough CV:  No palpitations, no LE edema.  GU:  No hematuria, no frequency GI:  Per history of present illness Heme:  She never previously had problems with low blood counts. She hasn't had any unusual excessive bleeding or bruising, though she does bruise quite easily.   Transfusions:  Never had transfusions in the past Neuro:  No headaches, no peripheral tingling or numbness.  She does have chronic imbalance that she attributes to her blindness as it is been present ever since she went blind. She uses a walker to balance when she moves around Derm:  No itching, no rash or sores.  Endocrine:  No sweats or chills.  No polyuria or dysuria Immunization:  Did not inquire Travel:  None beyond local counties in last few months.    PHYSICAL EXAM: Vital signs in last 24 hours: Filed Vitals:   01/13/14 1310  BP: 143/51  Pulse: 86  Temp: 98.7 F (37.1 C)  Resp: 24   Wt Readings from Last 3 Encounters:  01/13/14 169 lb (76.658 kg)  01/16/12 181 lb (82.101 kg)  01/14/12 181 lb (82.101 kg)    General: pleasant, elderly white female who looks chronically ill. She is not cachectic Head:  No signs of trauma, no asymmetry, no swelling  Eyes:  No icterus. No conjunctival pallor.  Her eyes are shut and the lenses are cloudy Ears:  Hearing not compromised.  Nose:  No discharge Mouth:  Mucous membranes are dry but clear. Dentition is in good repair. Neck:  No JVD, no thyromegaly no bruits no masses Lungs:  Clear to auscultation and percussion bilaterally.  No dyspnea Heart: regular rate and rhythm no murmurs rubs  or gallops Abdomen:  Soft, nondistended, nontender. Bowel sounds active and not tympanitic or tinkling.   Rectal: the rectal vault is smooth without masses. There is no stool to assay and there is no blood   Musc/Skeltl: no joint erythema or contractures. Extremities:  No pedal or lower extremity edema  Neurologic:  Oriented 3. Vision was not tested. No tremor. No limb weakness. Skin:  Some  purpura on the arms. Small bruise in the region of the coccyx Tattoos:  none Nodes:  No cervical adenopathy   Psych:  Pleasant, cooperative, engaged. Good historian  Intake/Output from previous day:   Intake/Output this shift:    LAB RESULTS:  Recent Labs  01/12/14 2246 01/13/14 0540 01/13/14 1051  WBC 5.4 5.0 5.0  HGB 8.4* 8.8* 9.4*  HCT 27.8* 29.3* 30.1*  PLT 196 206 191  MCV     83  BMET Lab Results  Component Value Date   NA 143 01/13/2014   NA 138 01/14/2012   NA 131* 11/22/2008   K 3.3* 01/13/2014   K 4.7 01/14/2012   K 4.8 11/22/2008   CL 101 01/13/2014   CL 104 01/14/2012   CL 102 11/22/2008   CO2 29 01/13/2014   CO2 26 01/14/2012   CO2 21 11/22/2008   GLUCOSE 95 01/13/2014   GLUCOSE 111* 01/14/2012   GLUCOSE 97 11/22/2008   BUN 10 01/13/2014   BUN 21 01/14/2012   BUN 10 11/22/2008   CREATININE 0.92 01/13/2014   CREATININE 1.28* 01/14/2012   CREATININE 1.31* 11/26/2011   CALCIUM 8.4 01/13/2014   CALCIUM 9.3 01/14/2012   CALCIUM 8.5 11/22/2008   LFT  Recent Labs  01/13/14 0540  PROT 6.0  ALBUMIN 2.8*  AST 22  ALT 8  ALKPHOS 62  BILITOT 0.9   PT/INR Lab Results  Component Value Date   INR 6.39* 01/13/2014   INR 1.17 01/16/2012   INR 2.03* 01/14/2012   Hepatitis Panel No results for input(s): HEPBSAG, HCVAB, HEPAIGM, HEPBIGM in the last 72 hours. C-Diff No components found for: CDIFF Lipase     Component Value Date/Time   LIPASE 14 01/13/2014 1051    Drugs of Abuse  No results found for: LABOPIA, COCAINSCRNUR, LABBENZ, AMPHETMU, THCU,  LABBARB   RADIOLOGY STUDIES: No results found.  ENDOSCOPIC STUDIES: [place] colon[place] in her hometown  IMPRESSION:   *  Normocytic anemia. She is FOBT positive. It's unclear what she's had overt melena or bleeding per rectum. The LAD is normal which weighs against acute upper GI bleed  *  supratherapeutic INR. INR now 6.3 but was 10 before treatment with IV vitamin K. Here, and hospital she is being transfused with FFP. On Coumadin for history of stroke.  *  Glaucoma. She is been blind for 25 years.    PLAN:     *  Begin empiric oral Protonix. Allow clears. Anticipate colonoscopic, endoscopic evaluations on 11/20 if INR is therapeutic.   Jennye Moccasin  01/13/2014, 2:11 PM Pager: [phone] Attending MD note:   I have taken a history, examined the patient, and reviewed the chart. I agree with the Advanced Practitioner's impression and recommendations. INR down to 6.39. GI bleed  Unsure if acute or/and chronic GIB, recent acute gastroenteritis in the setting of supratherapeutic INR.Marland Kitchen Hgb drop is not dramatic ,11.3 to 8.8 but she is heme positive. Pt agrees to undergo EGD/colon before resuming Coumadin.  [facility] Pager # [phone]

## 2014-01-13 NOTE — Progress Notes (Signed)
INITIAL NUTRITION ASSESSMENT  DOCUMENTATION CODES Per approved criteria  -Not Applicable   INTERVENTION: Resource Breeze po TID, each supplement provides 250 kcal and 9 grams of protein  NUTRITION DIAGNOSIS: Inadequate oral intake related to altered GI function as evidenced by n/v PTA.   Goal: Pt will meet >90% of estimated nutritional needs  Monitor:  Diet advancement, PO/supplement intake, labs, weight changes, I/O's  Reason for Assessment: MST=3  80 y.o. female  Admitting Dx: GI bleeding  Janice Barrett is a 80 y.o. female with the past medical history of A. Fib on Coumadin, history of stroke, CHF, blindness, hyperlipidemia, depression, anxiety, who presented with nausea, vomiting, diarrhea, GI bleeding, and supratherapeutic INR.  ASSESSMENT: [facility] with dx of GI bleed. Pt drowsy at time of visit, but able to answer simple questions. She confirms poor po intake and weight loss PTA. She estimates that she has lost 20# in the past month. However, she also reveals that her UBW is around 174#. Unable to confirm these statements due to limited documented wt hx.  She reports that she feels ready to eat clear liquids and will be able to tolerate. She is looking forward to the jello for dinner she requested.  Labs reviewed. K: 3.3.  Nutrition Focused Physical Exam:  Subcutaneous Fat:  Orbital Region: WDL Upper Arm Region: mild depletion Thoracic and Lumbar Region: WDL  Muscle:  Temple Region: WDL Clavicle Bone Region: WDL Clavicle and Acromion Bone Region: WDL Scapular Bone Region: WDL Dorsal Hand: WDL Patellar Region: WDL Anterior Thigh Region: WDL Posterior Calf Region: WDL  Edema: none present  Height: Ht Readings from Last 1 Encounters:  01/12/14 5\' 4"  (1.626 m)    Weight: Wt Readings from Last 1 Encounters:  01/13/14 169 lb (76.658 kg)    Ideal Body Weight: 120#  % Ideal Body Weight: 141%  Wt Readings from Last 10  Encounters:  01/13/14 169 lb (76.658 kg)  01/16/12 181 lb (82.101 kg)  01/14/12 181 lb (82.101 kg)    Usual Body Weight: 174#  % Usual Body Weight: 97%  BMI:  Body mass index is 28.99 kg/(m^2). Overweight  Estimated Nutritional Needs: Kcal: 1800-2000 Protein: 85-95 grams Fluid: 1.8-2.0 L  Skin:  Ecchymosis on rt sacrum and lt arm  Diet Order: Diet clear liquid  EDUCATION NEEDS: -No education needs identified at this time  No intake or output data in the 24 hours ending 01/13/14 1525  Last BM: 01/11/14  Labs:   Recent Labs Lab 01/13/14 0540  NA 143  K 3.3*  CL 101  CO2 29  BUN 10  CREATININE 0.92  CALCIUM 8.4  GLUCOSE 95    CBG (last 3)  No results for input(s): GLUCAP in the last 72 hours.  Scheduled Meds: . OxyCODONE  10 mg Oral TID  . [START ON 01/14/2014] pantoprazole  40 mg Oral Q0600  . simvastatin  40 mg Oral QPM  . sodium chloride  3 mL Intravenous Q12H  . timolol  1 drop Right Eye BID    Continuous Infusions: . sodium chloride 50 mL/hr at 01/13/14 0008    Past Medical History  Diagnosis Date  . Back pain, chronic   . Blind     Past Surgical History  Procedure Laterality Date  . Back surgery      Janice Barrett Janice Barrett, RD, LDN Pager: [phone] After hours Pager: [phone]

## 2014-01-13 NOTE — Evaluation (Signed)
Physical Therapy Evaluation Patient Details Name: Janice Barrett MRN: [id number] DOB: 02/14/1928 Today's Date: 01/13/2014   History of Present Illness  Pt admitted with nausea, vomiting, diarrhea, weakness, GIB and supratherapeutic INR  Workup underway.  Clinical Impression  Pt admitted with/for complications above.  Pt currently limited functionally due to the problems listed. ( See problems list.)   Pt will benefit from PT to maximize function and safety in order to get ready for next venue listed below.     Follow Up Recommendations SNF;Other (comment) (short term if pt and family agrees.)    Equipment Recommendations  None recommended by PT    Recommendations for Other Services       Precautions / Restrictions Precautions Precautions: Fall Restrictions Weight Bearing Restrictions: No      Mobility  Bed Mobility Overal bed mobility: Needs Assistance Bed Mobility: Supine to Sit     Supine to sit: Min guard     General bed mobility comments: up via R elbow and rail without assist but with effort  Transfers Overall transfer level: Needs assistance Equipment used: None Transfers: Sit to/from [employer] CorporationStand;Stand Pivot Transfers Sit to Stand: Min assist Stand pivot transfers: Min assist       General transfer comment: verbal and tactil guiding cues and minimal assist to come forward. [facility] to [facility] and then to the recliner.  Ambulation/Gait             General Gait Details: not tested today  Stairs            Wheelchair Mobility    Modified Rankin (Stroke Patients Only)       Balance Overall balance assessment: Needs assistance Sitting-balance support: No upper extremity supported;Single extremity supported Sitting balance-Leahy Scale: Fair     Standing balance support: Single extremity supported;Bilateral upper extremity supported Standing balance-Leahy Scale:  (limited by her surroundings) Standing balance comment: standing at EOB during  transfer steady.                             Pertinent Vitals/Pain Pain Assessment: No/denies pain    Home Living Family/patient expects to be discharged to:: Private residence (pt and husband living in a garage apartment at son's home) Living Arrangements: Spouse/significant other Available Help at Discharge: Family;Other (Comment);Available 24 hours/day (spouse is not very healthy either,  son's available) Type of Home: House Home Access: Level entry     Home Layout: One level Home Equipment: Walker - 2 wheels;Walker - 4 wheels;Bedside commode;Shower seat Additional Comments: pt is independent in the home    Prior Function Level of Independence: Independent with assistive device(s)               Hand Dominance        Extremity/Trunk Assessment   Upper Extremity Assessment: Overall WFL for tasks assessed;Generalized weakness           Lower Extremity Assessment: Generalized weakness (more notably is decrease stamina)         Communication   Communication: No difficulties  Cognition Arousal/Alertness: Awake/alert Behavior During Therapy: WFL for tasks assessed/performed Overall Cognitive Status: Within Functional Limits for tasks assessed                      General Comments      Exercises        Assessment/Plan    PT Assessment Patient needs continued PT services  PT Diagnosis Generalized weakness  PT Problem List Decreased strength;Decreased activity tolerance;Decreased mobility;Decreased knowledge of use of DME  PT Treatment Interventions DME instruction;Gait training;Functional mobility training;Therapeutic activities;Patient/family education   PT Goals (Current goals can be found in the Care Plan section) Acute Rehab PT Goals Patient Stated Goal: get back home PT Goal Formulation: With patient Time For Goal Achievement: 01/20/14 Potential to Achieve Goals: Good    Frequency Min 3X/week   Barriers to discharge         Co-evaluation               End of Session   Activity Tolerance: Patient tolerated treatment well Patient left: in chair;with call bell/phone within reach Nurse Communication: Mobility status         Time: 1627-1700 PT Time Calculation (min) (ACUTE ONLY): 33 min   Charges:   PT Evaluation $Initial PT Evaluation Tier I: 1 Procedure PT Treatments $Therapeutic Activity: 23-37 mins   PT G Codes:          Jacquelene Kopecky, Eliseo GumKenneth V 01/13/2014, 5:26 PM 01/13/2014  [place] BingKen Aime Meloche, PT [phone]  (pager)

## 2014-01-13 NOTE — Progress Notes (Signed)
Attempted to see pt. Pt remains on "strict bedrest."  Please update activity orders when appropriate for therapy to move forward.   [place] WaterHolly Azalynn Maxim, North CarolinaOTR/l [phone]

## 2014-01-13 NOTE — Care Management Note (Unsigned)
    Page 1 of 1   01/15/2014     5:27:08 PM CARE MANAGEMENT NOTE 01/15/2014  Patient:  Janice Barrett,Janice Barrett   Account Number:  [id number]  Date Initiated:  01/13/2014  Documentation initiated by:  Letha CapeAYLOR,DEBORAH  Subjective/Objective Assessment:   dx gib  admit- lives with spouse.     Action/Plan:   pt/ot eval-   Anticipated DC Date:  01/15/2014   Anticipated DC Plan:  HOME W HOME HEALTH SERVICES      DC Planning Services  CM consult      Choice offered to / List presented to:             Status of service:  In process, will continue to follow Medicare Important Message given?   (If response is "NO", the following Medicare IM given date fields will be blank) Date Medicare IM given:  01/15/2014 Medicare IM given by:  McNally,Kristin Date Additional Medicare IM given:   Additional Medicare IM given by:    Discharge Disposition:    Per UR Regulation:    If discussed at Long Length of Stay Meetings, dates discussed:    Comments:  01/15/14 kristin mcnally rn pt to go to snf in am as per ssw  01/15/14 kristin mcnally rn PT recommend snf, need to revisit with family as reported conflicting agreement  on snf plan. SSW following case as well.   01/13/14 1424 Letha Capeeborah Taylor RN, BSN [phone] patient lives with spouse, await pt eval.

## 2014-01-13 NOTE — Progress Notes (Signed)
CRITICAL VALUE ALERT  Critical value received:  INR 6.39, PT 56.6  Date of notification:  11/18  Time of notification:  0707  Critical value read back:Yes.    Nurse who received alert:  Marlon Pelora gardiner  MD notified (1st page):  [place]   Time of first page:  0709  MD notified (2nd page):  Time of second page:  Responding MD:    Time MD responded:    Also reported to receiving RN Lonnie.

## 2014-01-14 DIAGNOSIS — D62 Acute posthemorrhagic anemia: Secondary | ICD-10-CM

## 2014-01-14 DIAGNOSIS — I5032 Chronic diastolic (congestive) heart failure: Secondary | ICD-10-CM

## 2014-01-14 LAB — CBC
HCT: 29.7 % — ABNORMAL LOW (ref 36.0–46.0)
Hemoglobin: 8.9 g/dL — ABNORMAL LOW (ref 12.0–15.0)
MCH: 25.4 pg — ABNORMAL LOW (ref 26.0–34.0)
MCHC: 30 g/dL (ref 30.0–36.0)
MCV: 84.6 fL (ref 78.0–100.0)
Platelets: 191 10*3/uL (ref 150–400)
RBC: 3.51 MIL/uL — ABNORMAL LOW (ref 3.87–5.11)
RDW: 17 % — AB (ref 11.5–15.5)
WBC: 5.3 10*3/uL (ref 4.0–10.5)

## 2014-01-14 LAB — PREPARE FRESH FROZEN PLASMA: UNIT DIVISION: 0

## 2014-01-14 LAB — PROTIME-INR
INR: 2.19 — AB (ref 0.00–1.49)
Prothrombin Time: 24.6 seconds — ABNORMAL HIGH (ref 11.6–15.2)

## 2014-01-14 LAB — PREPARE RBC (CROSSMATCH)

## 2014-01-14 MED ORDER — FUROSEMIDE 10 MG/ML IJ SOLN
20.0000 mg | Freq: Once | INTRAMUSCULAR | Status: AC
  Administered 2014-01-14: 20 mg via INTRAVENOUS
  Filled 2014-01-14: qty 2

## 2014-01-14 MED ORDER — SODIUM CHLORIDE 0.9 % IV SOLN
Freq: Once | INTRAVENOUS | Status: AC
  Administered 2014-01-14: 16:00:00 via INTRAVENOUS

## 2014-01-14 MED ORDER — POTASSIUM CHLORIDE CRYS ER 20 MEQ PO TBCR
40.0000 meq | EXTENDED_RELEASE_TABLET | Freq: Once | ORAL | Status: AC
  Administered 2014-01-14: 40 meq via ORAL
  Filled 2014-01-14: qty 2

## 2014-01-14 MED ORDER — PEG-KCL-NACL-NASULF-NA ASC-C 100 G PO SOLR
1.0000 | Freq: Once | ORAL | Status: AC
  Administered 2014-01-14: 200 g via ORAL
  Filled 2014-01-14: qty 1

## 2014-01-14 NOTE — Progress Notes (Signed)
    Progress Note   Subjective  no complaints   Objective   Vital signs in last 24 hours: Temp:  [97.6 F (36.4 C)-98.9 F (37.2 C)] 98.4 F (36.9 C) (11/19 0500) Pulse Rate:  [82-94] 86 (11/19 0500) Resp:  [18-24] 20 (11/19 0500) BP: (139-151)/(41-81) 151/81 mmHg (11/19 0500) SpO2:  [96 %-99 %] 99 % (11/19 0500) Weight:  [172 lb 2.9 oz (78.1 kg)] 172 lb 2.9 oz (78.1 kg) (11/19 0500) Last BM Date: 01/13/14 General:    white female in NAD Heart:  Regular rate and rhythm; loud murmur ally Abdomen:  Soft, nontender and nondistended. Normal bowel sounds. Extremities:  Without edema. Neurologic:  Alert and oriented,  grossly normal neurologically. Psych:  Cooperative. Normal mood and affect.    Lab Results:  Recent Labs  01/13/14 0540 01/13/14 1051 01/13/14 2330  WBC 5.0 5.0 5.5  HGB 8.8* 9.4* 8.0*  HCT 29.3* 30.1* 26.2*  PLT 206 191 175   BMET  Recent Labs  01/13/14 0540  NA 143  K 3.3*  CL 101  CO2 29  GLUCOSE 95  BUN 10  CREATININE 0.92  CALCIUM 8.4   LFT  Recent Labs  01/13/14 0540  PROT 6.0  ALBUMIN 2.8*  AST 22  ALT 8  ALKPHOS 62  BILITOT 0.9   PT/INR  Recent Labs  01/13/14 0540 01/14/14 0613  LABPROT 56.6* 24.6*  INR 6.39* 2.19*      Assessment / Plan:    1. Normocytic anemia, heme + stool on coumadin. Patient will need EGD / colonoscopy before resuming coumadin. Her INR is down to 2.19 this am. I spoke with Hospitalist who will give assist in getting INR down further. INR below 2 is ideal. Continue clears today, plan for EGD/colonoscopy tomorrow.. The risks, benefits, and alternatives to EGD and colonoscopy with possible biopsies and polypectomy were discussed with the patient and she consents to proceed.   2. Glaucoma, patient is blind.     LOS: 2 days   Janice Barrett  01/14/2014, 9:38 AM  Attending MD note:   I have taken a history, examined the patient, and reviewed the chart. I agree with the Advanced Practitioner's  impression and recommendations. I have spoken to pt's husband who agrees with the plan for EGD/colon  Janice Roughora Larrissa Stivers,MD [facility] Pager # [phone]

## 2014-01-14 NOTE — Progress Notes (Signed)
Occupational Therapy Evaluation Patient Details Name: Janice Barrett MRN: [id number] DOB: 07/30/1927 Today's Date: 01/14/2014    History of Present Illness Pt admitted with nausea, vomiting, diarrhea, weakness, GIB and supratherapeutic INR  Workup underway.   Clinical Impression   Patient presents to OT with decreased ADL independence/safety and it is unclear if she will have necessary assistance at home due to husband in poor health as well. Would recommend short term rehab, though patient desires to return home. It is difficult to know whether patient is functioning so poorly due to unfamiliar environment (pt is blind) vs true decline in status.    Follow Up Recommendations  SNF (though patient wants to go home)    Equipment Recommendations       Recommendations for Other Services       Precautions / Restrictions Precautions Precautions: Fall Restrictions Weight Bearing Restrictions: No      Mobility Bed Mobility                  Transfers                      Balance                                            ADL Overall ADL's : Needs assistance/impaired Eating/Feeding: Moderate assistance;Bed level (blind, due to unfamiliar environment, nursing staff has to A)   Grooming: Wash/dry hands;Wash/dry face;Oral care;Moderate assistance;Bed level (blind, unfamiliair environment)   Upper Body Bathing: Maximal assistance   Lower Body Bathing: Maximal assistance               Toileting- Clothing Manipulation and Hygiene: Total assistance (reports nurses have had to change her after incontinence)       Functional mobility during ADLs:  (refused EOB/OOB) General ADL Comments: Patient reports she has been blind x 25 years. She reports she has needed increased assistance with ADLs at hospital due to unfamiliar environment, but also admits to recent functional decline x past month.     Vision                      Perception     Praxis      Pertinent Vitals/Pain Pain Assessment: No/denies pain     Hand Dominance Right   Extremity/Trunk Assessment Upper Extremity Assessment Upper Extremity Assessment: Overall WFL for tasks assessed   Lower Extremity Assessment Lower Extremity Assessment: Defer to PT evaluation       Communication Communication Communication: No difficulties   Cognition Arousal/Alertness: Awake/alert Behavior During Therapy: WFL for tasks assessed/performed Overall Cognitive Status: Within Functional Limits for tasks assessed                     General Comments       Exercises       Shoulder Instructions      Home Living Family/patient expects to be discharged to:: Private residence (pt and husband lives in a garage apartment at son's home) Living Arrangements: Spouse/significant other Available Help at Discharge: Family;Other (Comment);Available 24 hours/day Type of Home: House Home Access: Level entry     Home Layout: One level     Bathroom Shower/Tub: Producer, television/film/videoWalk-in shower   Bathroom Toilet: Standard     Home Equipment: Environmental consultantWalker - 2 wheels;Walker - 4 wheels;Bedside commode;Shower seat   Additional Comments:  pt is independent in the home      Prior Functioning/Environment Level of Independence: Independent with assistive device(s)             OT Diagnosis: Generalized weakness;Disturbance of vision   OT Problem List: Decreased strength;Decreased activity tolerance;Decreased safety awareness;Decreased knowledge of use of DME or AE;Impaired vision/perception   OT Treatment/Interventions: Self-care/ADL training;Therapeutic exercise;DME and/or AE instruction;Therapeutic activities;Visual/perceptual remediation/compensation;Patient/family education    OT Goals(Current goals can be found in the care plan section) Acute Rehab OT Goals Patient Stated Goal: get back home OT Goal Formulation: With patient Time For Goal Achievement:  01/21/14 Potential to Achieve Goals: Good  OT Frequency: Min 2X/week   Barriers to D/C:            Co-evaluation              End of Session    Activity Tolerance: Patient tolerated treatment well Patient left: in bed;with call bell/phone within reach   Time: 1015-1035 OT Time Calculation (min): 20 min Charges:  OT General Charges $OT Visit: 1 Procedure OT Evaluation $Initial OT Evaluation Tier I: 1 Procedure OT Treatments $Self Care/Home Management : 8-22 mins G-Codes:    Edu On A 01/14/2014, 1:09 PM

## 2014-01-14 NOTE — Progress Notes (Addendum)
[facility] PROGRESS NOTE  Janice Barrett [id number] DOB: 05/14/1927 DOA: 01/12/2014 PCP: Temple PaciniPOOL,HENRY A, MD  Assessment/Plan: Acute blood loss anemia due to GIB: it is most likely due to supratherapeutic INR secondary to Coumadin use. Patient had remote colonoscopy which was normal per patient. Her hemoglobin dropped from 11.3 on 12/2012 to 9.4. Currently patient is hemodynamically stable. No tachycardia. -will continue monitoring her on tele bed (given a. Fib and GIB) -continue holding coumadin -follow Hgb trend -NPO for now -will transfuse 1 unit of PRBC; continue supportive care. Given hx of HF; will change IVF's to 6220ml/hr -continue Protonix  - lactic acid normal -GI consulted; plan is for EGD[facility]colonoscopy in am [facility]  Nausea, vomiting and diarrhea: Unclear etiology. Gastroenteritis is a potential differential diagnosis. patient did not have antibiotics use recently. -will follow GI pathogen panel and c. Diff results -continue PRN Zofran for nausea/vomiting -on Protonix as above  supratherapeutic INR: INR>10. -continue holding coumaidn -repeated INR today (11/20) demonstrated level at 2.19 -s/p 1 unit of FFP and 15mg  of Vit K -Hgb 8.9 most recent from her records 11.3; given reduction in Hgb and the fact her INR is still borderline high for EGD/colonoscopy procedure, will transfuse 1 unit of PRBC -follow Hgb trend and INR trend -per GI EGD/colonoscopy in am (11/20)  Afib: heart rate is well controlled. -hold Coumadin due to supratherapeutic INR and active GIB  CHF: not sure it is diastolic or systolic congestive heart failure. Patient is euvolemic on admission. She does not have any leg edema. -overall compensated; no SOB or orthopnea -lasix to be given today after transfusion   Hx of stroke: no deficit. stable - Hold aspirin and coumadin due to GI bleeding - continue Zocor  HLD -continue statins  Abnormal EKG: EKG showed left bundle  blockage, but no previous EKG to compare with. Pt does not have chest pain. - trop x 3 - repeated EKG w/o acute ischemic changes  Hypokalemia: -will replete and follow electrolytes  Code Status: Partial Code (no intubation) Family Communication: son and husband at bedside Disposition Plan: to be determine    Consultants:  GI   Procedures:  None   Antibiotics:  None   HPI/Subjective: Tire and fatigue; no CP. Patient INR significantly improved; but still slightly above goal for EGD/colonoscopy. Hgb 8.9  Objective: Filed Vitals:   01/14/14 1308  BP: 141/54  Pulse: 75  Temp: 98.5 F (36.9 C)  Resp: 20    Intake/Output Summary (Last 24 hours) at 01/14/14 1401 Last data filed at 01/14/14 0500  Gross per 24 hour  Intake    744 ml  Output      0 ml  Net    744 ml   Filed Weights   01/12/14 2143 01/13/14 0420 01/14/14 0500  Weight: 76.658 kg (169 lb) 76.658 kg (169 lb) 78.1 kg (172 lb 2.9 oz)    Exam:   General:  Afebrile, denies CP, abd pain or N/V  Cardiovascular: controlled rate, positive SEM, no rubs or gallops  Respiratory: CTA bilaterally  Abdomen: soft, NT, ND, positive BS  Musculoskeletal: no edema or cyanosis   Data Reviewed: Basic Metabolic Panel:  Recent Labs Lab 01/13/14 0540  NA 143  K 3.3*  CL 101  CO2 29  GLUCOSE 95  BUN 10  CREATININE 0.92  CALCIUM 8.4   Liver Function Tests:  Recent Labs Lab 01/13/14 0540  AST 22  ALT 8  ALKPHOS 62  BILITOT 0.9  PROT 6.0  ALBUMIN 2.8*    Recent Labs Lab 01/13/14 1051  LIPASE 14   CBC:  Recent Labs Lab 01/12/14 2246 01/13/14 0540 01/13/14 1051 01/13/14 2330 01/14/14 0613  WBC 5.4 5.0 5.0 5.5 5.3  HGB 8.4* 8.8* 9.4* 8.0* 8.9*  HCT 27.8* 29.3* 30.1* 26.2* 29.7*  MCV 80.8 81.6 83.4 83.4 84.6  PLT 196 206 191 175 191   Cardiac Enzymes:  Recent Labs Lab 01/13/14 0033 01/13/14 0540 01/13/14 1057  TROPONINI <0.30 <0.30 <0.30    Studies: No results  found.  Scheduled Meds: . sodium chloride   Intravenous Once  . feeding supplement (RESOURCE BREEZE)  1 Container Oral TID BM  . furosemide  20 mg Intravenous Once  . OxyCODONE  10 mg Oral TID  . pantoprazole  40 mg Oral Q0600  . simvastatin  40 mg Oral QPM  . sodium chloride  3 mL Intravenous Q12H  . timolol  1 drop Right Eye BID   Continuous Infusions: . sodium chloride 50 mL/hr at 01/14/14 0250    Principal Problem:   GI bleeding Active Problems:   Supratherapeutic INR   Congestive heart failure   Essential hypertension   Hyperlipidemia   Blindness of both eyes   Atrial fibrillation   History of stroke   Nausea vomiting and diarrhea   Abnormal EKG    Time spent: 30 minutes    Vassie LollMadera, Jathen Sudano  [facility] Pager [phone]. If 7PM-7AM, please contact night-coverage at [email or web address], password [email or web address] 01/14/2014, 2:01 PM  LOS: 2 days

## 2014-01-14 NOTE — Clinical Social Work Psychosocial (Signed)
Clinical Social Work Department BRIEF PSYCHOSOCIAL ASSESSMENT 01/14/2014  Patient:  Janice Barrett, Janice Barrett     Account Number:  [id number]     Admit date:  01/12/2014  Clinical Social Worker:  Lovey Newcomer  Date/Time:  01/14/2014 11:00 AM  Referred by:  Physician  Date Referred:  01/14/2014 Referred for  SNF Placement   Other Referral:   NA   Interview type:  Patient Other interview type:   Patient and husband interviewed at bedside.    PSYCHOSOCIAL DATA Living Status:  HUSBAND Admitted from facility:   Level of care:   Primary support name:  Monica Martinez Primary support relationship to patient:  SPOUSE Degree of support available:   Support is good.    CURRENT CONCERNS Current Concerns  Post-Acute Placement   Other Concerns:   NA    SOCIAL WORK ASSESSMENT / PLAN CSW met with the patient and husband at bedside to complete assessment. Patient states that she lives at home with her husband and is agreeable to SNF placement for short term rehab at discharge. CSW explained SNF search/placement process and answered questions. Patient states she will want placement in [facility] or [place]. Patient and husband were calm and engaged in assessment. CSW will follow up with bed offers.   Assessment/plan status:  Psychosocial Support/Ongoing Assessment of Needs Other assessment/ plan:   Complete FL2, Fax, PASRR   Information/referral to community resources:   CSW contact information and SNF list given.    PATIENT'S/FAMILY'S RESPONSE TO PLAN OF CARE: Patient agreeable to short term SNF placement at discharge. CSW will assist.       Liz Beach MSW, [place], [place], [phone]

## 2014-01-15 ENCOUNTER — Encounter (HOSPITAL_COMMUNITY): Payer: Self-pay

## 2014-01-15 ENCOUNTER — Inpatient Hospital Stay (HOSPITAL_COMMUNITY): Payer: Medicare Other | Admitting: Anesthesiology

## 2014-01-15 ENCOUNTER — Encounter (HOSPITAL_COMMUNITY)
Admission: AD | Disposition: A | Discharge status: Skilled Nursing Facility | Payer: Self-pay | Source: Other Acute Inpatient Hospital | Attending: Internal Medicine

## 2014-01-15 ENCOUNTER — Encounter (HOSPITAL_COMMUNITY)
Admission: AD | Disposition: A | Discharge status: Skilled Nursing Facility | Payer: Medicare Other | Source: Other Acute Inpatient Hospital | Attending: Internal Medicine

## 2014-01-15 HISTORY — PX: ESOPHAGOGASTRODUODENOSCOPY: SHX5428

## 2014-01-15 HISTORY — PX: COLONOSCOPY: SHX5424

## 2014-01-15 HISTORY — PX: GIVENS CAPSULE STUDY: SHX5432

## 2014-01-15 LAB — CBC
HCT: 35.2 % — ABNORMAL LOW (ref 36.0–46.0)
HEMOGLOBIN: 10.5 g/dL — AB (ref 12.0–15.0)
MCH: 25.5 pg — AB (ref 26.0–34.0)
MCHC: 29.8 g/dL — AB (ref 30.0–36.0)
MCV: 85.4 fL (ref 78.0–100.0)
PLATELETS: 183 10*3/uL (ref 150–400)
RBC: 4.12 MIL/uL (ref 3.87–5.11)
RDW: 16.8 % — ABNORMAL HIGH (ref 11.5–15.5)
WBC: 5.5 10*3/uL (ref 4.0–10.5)

## 2014-01-15 LAB — TYPE AND SCREEN
ABO/RH(D): O POS
ANTIBODY SCREEN: NEGATIVE
UNIT DIVISION: 0

## 2014-01-15 LAB — PROTIME-INR
INR: 1.5 — ABNORMAL HIGH (ref 0.00–1.49)
PROTHROMBIN TIME: 18.2 s — AB (ref 11.6–15.2)

## 2014-01-15 SURGERY — IMAGING PROCEDURE, GI TRACT, INTRALUMINAL, VIA CAPSULE
Anesthesia: LOCAL

## 2014-01-15 SURGERY — COLONOSCOPY
Anesthesia: Monitor Anesthesia Care

## 2014-01-15 MED ORDER — ONDANSETRON HCL 4 MG/2ML IJ SOLN
4.0000 mg | Freq: Four times a day (QID) | INTRAMUSCULAR | Status: DC | PRN
Start: 2014-01-15 — End: 2014-01-15

## 2014-01-15 MED ORDER — LACTATED RINGERS IV SOLN
INTRAVENOUS | Status: DC | PRN
  Administered 2014-01-15: 10:00:00 via INTRAVENOUS

## 2014-01-15 MED ORDER — OXYCODONE HCL 5 MG PO TABS: 5.0000 mg | ORAL_TABLET | Freq: Once | ORAL | Status: DC | PRN

## 2014-01-15 MED ORDER — ATORVASTATIN CALCIUM 20 MG PO TABS
20.0000 mg | ORAL_TABLET | Freq: Every day | ORAL | Status: DC
  Administered 2014-01-15: 20 mg via ORAL
  Filled 2014-01-15 (×2): qty 1

## 2014-01-15 MED ORDER — ONDANSETRON HCL 4 MG/2ML IJ SOLN
INTRAMUSCULAR | Status: DC | PRN
  Administered 2014-01-15: 4 mg via INTRAVENOUS

## 2014-01-15 MED ORDER — FUROSEMIDE 20 MG PO TABS
20.0000 mg | ORAL_TABLET | Freq: Every day | ORAL | Status: DC
  Administered 2014-01-15 – 2014-01-16 (×2): 20 mg via ORAL
  Filled 2014-01-15 (×2): qty 1

## 2014-01-15 MED ORDER — FENTANYL CITRATE 0.05 MG/ML IJ SOLN: 25.0000 ug | INTRAMUSCULAR | Status: DC | PRN

## 2014-01-15 MED ORDER — PROPOFOL INFUSION 10 MG/ML OPTIME
INTRAVENOUS | Status: DC | PRN
  Administered 2014-01-15: 75 ug/kg/min via INTRAVENOUS

## 2014-01-15 MED ORDER — BUTAMBEN-TETRACAINE-BENZOCAINE 2-2-14 % EX AERO
INHALATION_SPRAY | CUTANEOUS | Status: DC | PRN
  Administered 2014-01-15: 2 via TOPICAL

## 2014-01-15 MED ORDER — OXYCODONE HCL 5 MG/5ML PO SOLN: 5.0000 mg | Freq: Once | ORAL | Status: DC | PRN

## 2014-01-15 MED ORDER — PROPOFOL 10 MG/ML IV BOLUS
INTRAVENOUS | Status: DC | PRN
  Administered 2014-01-15: 20 mg via INTRAVENOUS

## 2014-01-15 MED ORDER — AMLODIPINE BESYLATE 5 MG PO TABS
5.0000 mg | ORAL_TABLET | Freq: Every day | ORAL | Status: DC
  Administered 2014-01-15 – 2014-01-16 (×2): 5 mg via ORAL
  Filled 2014-01-15 (×2): qty 1

## 2014-01-15 MED ORDER — POTASSIUM CHLORIDE CRYS ER 20 MEQ PO TBCR
20.0000 meq | EXTENDED_RELEASE_TABLET | Freq: Every day | ORAL | Status: DC
  Administered 2014-01-15 – 2014-01-16 (×2): 20 meq via ORAL
  Filled 2014-01-15 (×3): qty 1

## 2014-01-15 SURGICAL SUPPLY — 1 items: TOWEL COTTON PACK 4EA (MISCELLANEOUS) ×4 IMPLANT

## 2014-01-15 NOTE — Clinical Social Work Note (Signed)
Patient and family have confirmed choice of SNF [facility] [facility] Facility states they are prepared for a weekend admission. MD, please write DC order and DC Summary once patient is medically stable for DC. Family states they will transport the patient. CSW has left report for weekend CSW.  Roddie McBryant Emarion Toral MSW, [place], [place], [phone]

## 2014-01-15 NOTE — Transfer of Care (Signed)
Immediate Anesthesia Transfer of Care Note  Patient: Janice Barrett  Procedure(s) Performed: Procedure(s): COLONOSCOPY (N/A) ESOPHAGOGASTRODUODENOSCOPY (EGD) (N/A)  Patient Location: Endoscopy Unit  Anesthesia Type:MAC  Level of Consciousness: awake, alert  and oriented  Airway & Oxygen Therapy: Patient Spontanous Breathing and Patient connected to nasal cannula oxygen  Post-op Assessment: Report given to PACU RN and Post -op Vital signs reviewed and stable  Post vital signs: Reviewed and stable  Complications: No apparent anesthesia complications

## 2014-01-15 NOTE — Op Note (Signed)
[facility] [address] [place], [id number]   COLONOSCOPY PROCEDURE REPORT  PATIENT: Janice Barrett, Janice Barrett  MR#: [id number] BIRTHDATE: Feb 28, 1927 , 86  yrs. old GENDER: female ENDOSCOPIST: Hart Carwinora M Betzabe Bevans, MD REFERRED BY:Dr Darlina RumpfHuffman, [place], Dr Gwenlyn PerkingMadera PROCEDURE DATE:  01/15/2014 PROCEDURE:   Colonoscopy, diagnostic First Screening Colonoscopy - Avg.  risk and is 50 yrs.  old or older - No.  Prior Negative Screening - Now for repeat screening. 10 or more years since last screening  History of Adenoma - Now for follow-up colonoscopy & has been > or = to 3 yrs.  N/A  Polyps Removed Today? No.  Polyps Removed Today? No.  Recommend repeat exam, <10 yrs? Polyps Removed Today? No.  Recommend repeat exam, <10 yrs? No. ASA CLASS:   Class III INDICATIONS:anemia.  Supratherapeutic INR.  Patient on Coumadin. Hemoglobin dropped from 11.3-8.8.  Patient is blind.  Unable to see color of her stools. MEDICATIONS: Monitored anesthesia care  DESCRIPTION OF PROCEDURE:   After the risks benefits and alternatives of the procedure were thoroughly explained, informed consent was obtained.  The digital rectal exam revealed no abnormalities of the rectum.   The Pentax Ped Colon [id number] endoscope was introduced through the anus and advanced to the cecum, which was identified by both the appendix and ileocecal valve. No adverse events experienced.   The quality of the prep was good, using MoviPrep  The instrument was then slowly withdrawn as the colon was fully examined.      COLON FINDINGS: A normal appearing cecum, ileocecal valve, and appendiceal orifice were identified.  The ascending, transverse, descending, sigmoid colon, and rectum appeared unremarkable. Retroflexed views revealed no abnormalities. The time to cecum=4 minutes 32 seconds.  Withdrawal time=715 minutes 0 seconds.  The scope was withdrawn and the procedure completed. COMPLICATIONS: There were no  immediate complications.  ENDOSCOPIC IMPRESSION: Normal colonoscopy , no evidence of diverticulosis nothing to account for GI blood loss  RECOMMENDATIONS: small bowel capsule endoscopy today prior to restarting Coumadin Advance diet Follow H&H she may be discharged after completion of the capsule and we will notify her of the results. Would hold Coumadin till capsule results available  eSigned:  Hart Carwinora M Makendra Vigeant, MD 01/15/2014 10:56 AM   cc:   PATIENT NAME:  Janice Barrett, Janice Barrett MR#: [id number]

## 2014-01-15 NOTE — Anesthesia Postprocedure Evaluation (Signed)
Anesthesia Post Note  Patient: Janice Barrett  Procedure(s) Performed: Procedure(s) (LRB): COLONOSCOPY (N/A) ESOPHAGOGASTRODUODENOSCOPY (EGD) (N/A)  Anesthesia type: MAC  Patient location: PACU  Post pain: Pain level controlled and Adequate analgesia  Post assessment: Post-op Vital signs reviewed, Patient's Cardiovascular Status Stable and Respiratory Function Stable  Last Vitals:  Filed Vitals:   01/15/14 1100  BP:   Pulse: 87  Temp: 37.1 C  Resp: 23    Post vital signs: Reviewed and stable  Level of consciousness: awake, alert  and oriented  Complications: No apparent anesthesia complications

## 2014-01-15 NOTE — Op Note (Signed)
[facility] [address] [place], [address]   ENDOSCOPY PROCEDURE REPORT  PATIENT: Janice Barrett, Janice Barrett  MR#: [id number] BIRTHDATE: Jul 11, 1927 , 86  yrs. old GENDER: female ENDOSCOPIST: Hart Carwinora M Tekeyah Santiago, MD REFERRED BY:  Dr Vassie Lollarlos [place] ,Dr Darlina RumpfHuffman [place] PROCEDURE DATE:  01/15/2014 PROCEDURE:  EGD, diagnostic ASA CLASS:     Class III INDICATIONS:  heme positive anemia.  Nausea and vomiting.  INR greater than 10 on admission.  Hemoglobin has dropped from 11.3-8.8.  Coumadin for atrial fibrillation. MEDICATIONS: Monitored anesthesia care TOPICAL ANESTHETIC: Cetacaine Spray  DESCRIPTION OF PROCEDURE: After the risks benefits and alternatives of the procedure were thoroughly explained, informed consent was obtained.  The Pentax Gastroscope [id number] endoscope was introduced through the mouth and advanced to the second portion of the duodenum , Without limitations.  The instrument was slowly withdrawn as the mucosa was fully examined.    Esophagus:[ proximal, mid and as distal esophageal mucosa appeared normal. Squamocolumnar junction was normal. It was located at 32 cm from the incisors there was no stricture Stomach: there was a moderate size hiatal hernia extending from 32-37 cm from the incisors each was normal reducible. There were no Cameron erosions. Gastric antrum and pyloric outlet was normal. Retroflexion of endoscope confirmed presence of hiatal hernia  Duodenal bulb and descending duodenum was normal     The scope was then withdrawn from the patient and the procedure completed.  COMPLICATIONS: There were no immediate complications.  ENDOSCOPIC IMPRESSION: 5 cm nonreducible hiatal hernia without Cameron erosions nothing to account for GI blood loss  RECOMMEndations:  proceed with colonoscopy  REPEAT EXAM: no  eSigned:  Hart Carwinora M Mariaguadalupe Fialkowski, MD 01/15/2014 10:50 AM    CC:  PATIENT NAME:  Janice Barrett, Janice Barrett MR#: [id number]

## 2014-01-15 NOTE — Anesthesia Preprocedure Evaluation (Signed)
Anesthesia Evaluation  Patient identified by MRN, date of birth, ID band Patient awake    Reviewed: Allergy & Precautions, H&P , NPO status , Patient's Chart, lab work & pertinent test results  Airway Mallampati: II   Neck ROM: full    Dental   Pulmonary neg pulmonary ROS,          Cardiovascular hypertension, +CHF + dysrhythmias Atrial Fibrillation  Takes coumadin   Neuro/Psych CVA, No Residual Symptoms    GI/Hepatic GI bleed.   Endo/Other    Renal/GU      Musculoskeletal   Abdominal   Peds  Hematology   Anesthesia Other Findings   Reproductive/Obstetrics                             Anesthesia Physical Anesthesia Plan  ASA: III  Anesthesia Plan: MAC   Post-op Pain Management:    Induction: Intravenous  Airway Management Planned: Nasal Cannula  Additional Equipment:   Intra-op Plan:   Post-operative Plan:   Informed Consent: I have reviewed the patients History and Physical, chart, labs and discussed the procedure including the risks, benefits and alternatives for the proposed anesthesia with the patient or authorized representative who has indicated his/her understanding and acceptance.     Plan Discussed with: CRNA, Anesthesiologist and Surgeon  Anesthesia Plan Comments:         Anesthesia Quick Evaluation

## 2014-01-15 NOTE — Clinical Social Work Placement (Addendum)
Clinical Social Work Department CLINICAL SOCIAL WORK PLACEMENT NOTE 01/15/2014  Patient:  Janice Barrett,Janice Barrett  Account Number:  [id number] Admit date:  01/12/2014  Clinical Social Worker:  Cherre BlancJOSEPH BRYANT CAMPBELL, [place]  Date/time:  01/14/2014 07:57 PM  Clinical Social Work is seeking post-discharge placement for this patient at the following level of care:   SKILLED NURSING   (*CSW will update this form in Epic as items are completed)   01/14/2014  Patient/family provided with [facility] Department of Clinical Social Work's list of facilities offering this level of care within the geographic area requested by the patient (or if unable, by the patient's family).  01/14/2014  Patient/family informed of their freedom to choose among providers that offer the needed level of care, that participate in Medicare, Medicaid or managed care program needed by the patient, have an available bed and are willing to accept the patient.  01/14/2014  Patient/family informed of [facility]' ownership interest in [facility], as well as of the fact that they are under no obligation to receive care at this facility.  PASARR submitted to EDS on  PASARR number received on   FL2 transmitted to all facilities in geographic area requested by pt/family on  01/14/2014 FL2 transmitted to all facilities within larger geographic area on   Patient informed that his/her managed care company has contracts with or will negotiate with  certain facilities, including the following:     Patient/family informed of bed offers received:  01/15/2014 Patient chooses bed at Other Physician recommends and patient chooses bed at    Patient to be transferred to Other on 01/16/2014-Ifeoluwa Bartz [place], LCSWA Patient to be transferred to facility by Family wants to transport -patient husband Algernon Huxley([place]) Patient and family notified of transfer on 01/16/2014 Name of family member notified:  [place]-husband  The following  physician request were entered in Epic:   Additional Comments:   [facility].   Roddie McBryant Campbell MSW, [place]WA, [place], [phone]

## 2014-01-15 NOTE — Progress Notes (Signed)
[facility] PROGRESS NOTE  Janice Barrett ZOX:[id number] DOB: 01/23/1928 DOA: 01/12/2014 PCP: Temple PaciniPOOL,HENRY A, MD  Assessment/Plan: Acute blood loss anemia due to GIB: it is most likely due to supratherapeutic INR secondary to Coumadin use. Patient had remote colonoscopy which was normal per patient. Her hemoglobin dropped from 11.3 on 12/2012 to 9.4. Currently patient is hemodynamically stable. No tachycardia. -will continue monitoring her on tele bed (given A. Fib and GIB) -continue holding coumadin and ASA -follow Hgb trend -diet to be advance as per GI rec's -will transfuse 1 unit of PRBC; continue supportive care. Given hx of HF; will change IVF's to 3520ml/hr -continue Protonix  - lactic acid normal -EGD/colonoscopy w/o explanations for GIB; will proceed with capsule endoscopy to evaluate small intestine -[facility]  Nausea, vomiting and diarrhea: Unclear etiology. Gastroenteritis is a potential differential diagnosis. patient did not have antibiotics use recently. -will follow GI pathogen panel and c. Diff results -continue PRN Zofran for nausea/vomiting -on Protonix as above  supratherapeutic INR: [facility]>10 [facility] -continue holding coumaidn -repeated INR today (11/20) demonstrated level at 2.19 -s/p 1 unit of FFP, 1 unit of PRBC and 15mg  of Vit K -Hgb 10.4 and INR 1.5 -EGD/colonoscopy (11/20) w/o abnormalities or sources for bleeding  Afib: heart rate is well controlled. -hold Coumadin due to supratherapeutic INR and active GIB  CHF: not sure it is diastolic or systolic congestive heart failure. Patient is euvolemic on admission. She does not have any leg edema. -overall compensated; no SOB or orthopnea -will resume lasix  Hx of stroke: no deficit. stable - Holding aspirin and coumadin due to GI bleeding - continue Zocor  HLD -continue statins  Abnormal EKG: EKG showed left bundle blockage, but no previous EKG to compare  with. Pt does not have chest pain. - trop x 3 - repeated EKG w/o acute ischemic changes  Hypokalemia: -will replete and follow electrolytes  Code Status: Partial Code (no intubation) Family Communication: son and husband at bedside Disposition Plan: to be determine    Consultants:  GI   Procedures:  None   Antibiotics:  None   HPI/Subjective: Somnolent after EGD/colonoscopy; patient is easily arouse and able to follow commands. No fever. No further bleeding.  Objective: Filed Vitals:   01/15/14 1412  BP: 139/47  Pulse: 80  Temp:   Resp: 16    Intake/Output Summary (Last 24 hours) at 01/15/14 1643 Last data filed at 01/15/14 1113  Gross per 24 hour  Intake   1960 ml  Output      4 ml  Net   1956 ml   Filed Weights   01/13/14 0420 01/14/14 0500 01/15/14 0647  Weight: 76.658 kg (169 lb) 78.1 kg (172 lb 2.9 oz) 79.3 kg (174 lb 13.2 oz)    Exam:   General:  Afebrile, denies CP, abd pain or N/V; patient is somnolent after EGD/colonoscopy  Cardiovascular: controlled rate, positive SEM, no rubs or gallops  Respiratory: CTA bilaterally  Abdomen: soft, NT, ND, positive BS  Musculoskeletal: no edema or cyanosis   Data Reviewed: Basic Metabolic Panel:  Recent Labs Lab 01/13/14 0540  NA 143  K 3.3*  CL 101  CO2 29  GLUCOSE 95  BUN 10  CREATININE 0.92  CALCIUM 8.4   Liver Function Tests:  Recent Labs Lab 01/13/14 0540  AST 22  ALT 8  ALKPHOS 62  BILITOT 0.9  PROT 6.0  ALBUMIN 2.8*    Recent Labs Lab 01/13/14 1051  LIPASE 14   CBC:  Recent Labs Lab 01/13/14 0540 01/13/14 1051 01/13/14 2330 01/14/14 0613 01/15/14 0800  WBC 5.0 5.0 5.5 5.3 5.5  HGB 8.8* 9.4* 8.0* 8.9* 10.5*  HCT 29.3* 30.1* 26.2* 29.7* 35.2*  MCV 81.6 83.4 83.4 84.6 85.4  PLT 206 191 175 191 183   Cardiac Enzymes:  Recent Labs Lab 01/13/14 0033 01/13/14 0540 01/13/14 1057  TROPONINI <0.30 <0.30 <0.30    Studies: No results found.  Scheduled  Meds: . amLODipine  5 mg Oral Daily  . atorvastatin  20 mg Oral q1800  . feeding supplement (RESOURCE BREEZE)  1 Container Oral TID BM  . furosemide  20 mg Oral Daily  . OxyCODONE  10 mg Oral TID  . pantoprazole  40 mg Oral Q0600  . potassium chloride SA  20 mEq Oral Daily  . sodium chloride  3 mL Intravenous Q12H  . timolol  1 drop Right Eye BID   Continuous Infusions: . sodium chloride 20 mL/hr at 01/14/14 1539    Principal Problem:   GI bleeding Active Problems:   Supratherapeutic INR   Congestive heart failure   Essential hypertension   Hyperlipidemia   Blindness of both eyes   Atrial fibrillation   History of stroke   Nausea vomiting and diarrhea   Abnormal EKG    Time spent: 30 minutes    Vassie LollMadera, Anjel Perfetti  [facility] Pager [phone]. If 7PM-7AM, please contact night-coverage at [email or web address], password [email or web address] 01/15/2014, 4:43 PM  LOS: 3 days

## 2014-01-15 NOTE — Plan of Care (Signed)
Problem: Phase III Progression Outcomes Goal: Activity at appropriate level-compared to baseline (UP IN CHAIR FOR HEMODIALYSIS)  Outcome: Completed/Met Date Met:  01/15/14 Goal: Discharge plan remains appropriate-arrangements made Outcome: Completed/Met Date Met:  01/15/14 Goal: H&H stablized <1gm drop in 24 hrs, no active bleeding Outcome: Completed/Met Date Met:  01/15/14 Goal: Other Phase III Outcomes/Goals Outcome: Completed/Met Date Met:  01/15/14

## 2014-01-15 NOTE — Progress Notes (Signed)
IM given today 01/15/14 by Laverna Peacekristin Keyonna Comunale RN

## 2014-01-16 LAB — PROTIME-INR
INR: 1.39 (ref 0.00–1.49)
Prothrombin Time: 17.2 seconds — ABNORMAL HIGH (ref 11.6–15.2)

## 2014-01-16 LAB — CBC
HEMATOCRIT: 33.7 % — AB (ref 36.0–46.0)
Hemoglobin: 10 g/dL — ABNORMAL LOW (ref 12.0–15.0)
MCH: 24.8 pg — ABNORMAL LOW (ref 26.0–34.0)
MCHC: 29.7 g/dL — AB (ref 30.0–36.0)
MCV: 83.6 fL (ref 78.0–100.0)
Platelets: 168 10*3/uL (ref 150–400)
RBC: 4.03 MIL/uL (ref 3.87–5.11)
RDW: 16.8 % — AB (ref 11.5–15.5)
WBC: 5.6 10*3/uL (ref 4.0–10.5)

## 2014-01-16 MED ORDER — BOOST / RESOURCE BREEZE PO LIQD: 1.0000 | Freq: Two times a day (BID) | ORAL | Status: AC

## 2014-01-16 MED ORDER — OXYCODONE HCL ER 10 MG PO T12A: 10.0000 mg | EXTENDED_RELEASE_TABLET | Freq: Three times a day (TID) | ORAL | Status: AC

## 2014-01-16 MED ORDER — PANTOPRAZOLE SODIUM 40 MG PO TBEC: 40.0000 mg | DELAYED_RELEASE_TABLET | Freq: Every day | ORAL | Status: AC

## 2014-01-16 NOTE — Progress Notes (Signed)
    Progress Note   Subjective  Feels fine, visitors in room. Eating breakfast   Objective   Vital signs in last 24 hours: Temp:  [97.9 F (36.6 C)-99.3 F (37.4 C)] 98.5 F (36.9 C) (11/21 0506) Pulse Rate:  [74-96] 75 (11/21 0506) Resp:  [12-25] 12 (11/21 0506) BP: (131-155)/(34-58) 131/41 mmHg (11/21 0506) SpO2:  [94 %-100 %] 98 % (11/21 0506) Last BM Date: 01/15/14 General:    white female in NAD Abdomen:  Soft, nontender and nondistended. Normal bowel sounds. Extremities:  Without edema. Neurologic:  Alert and oriented,  grossly normal neurologically. Psych:  Cooperative. Normal mood and affect.    Lab Results:  Recent Labs  01/14/14 0613 01/15/14 0800 01/16/14 0641  WBC 5.3 5.5 5.6  HGB 8.9* 10.5* 10.0*  HCT 29.7* 35.2* 33.7*  PLT 191 183 168   PT/INR  Recent Labs  01/15/14 0800 01/16/14 0641  LABPROT 18.2* 17.2*  INR 1.50* 1.39      Assessment / Plan:    80 year old female with normocytic anemia, heme + stool on coumadin. EGD / colonoscopy negative. Hgb up to 10 post one unit of blood. Small bowel capsule study results pending. Patient stable for discharge from GI standpoint. I spoke to Hospitalist who feels that coumadin needs to be restarted and will restart it upon discharge today. We will call patient will capsule study results. .      LOS: 4 days   Willette ClusterPaula Guenther  01/16/2014, 9:04 AM  Attending MD note:   I have taken a history,  and reviewed the chart. I agree with the Advanced Practitioner's impression and recommendations. SBCE results pending. OK to discharge.  Willa Roughora Emmaleah Meroney,MD [facility] Pager # [phone]

## 2014-01-16 NOTE — Clinical Social Work Note (Signed)
CSW made aware patient ready for d/c to [facility] by [employer]. CSW contacted facility and spoke with [place] who confirmed bed availability. CSW met with patient and husband who are agreeable to d/c plan. CSW faxed d/c summary and copy of RX to facility. CSW prepared d/c packet and provided to husband who will transport patient to facility. CSW provided RN with number for report. No further needs. CSW signing off.  [place], [address] Weekend Clinical Social Worker [phone]

## 2014-01-16 NOTE — Discharge Summary (Signed)
Physician Discharge Summary  Janice Barrett UJW:[id number] DOB: 06/23/1927 DOA: 01/12/2014  PCP: Temple PaciniPOOL,HENRY A, MD  Admit date: 01/12/2014 Discharge date: 01/16/2014  Time spent: >30 minutes  Recommendations for Outpatient Follow-up:  Hold on the use of Aspirin Follow a low sodium diet (less than 2 gram daily) Ok to resume coumadin at home dose; close follow up of INR level Check BMET in 5 days to follow electrolytes and renal function Check CBC in 5 days to follow Hgb trend Rehabilitation as per SNF physical therapy protocol  Discharge Diagnoses:  Principal Problem:   GI bleeding Active Problems:   Supratherapeutic INR   Congestive heart failure   Essential hypertension   Hyperlipidemia   Blindness of both eyes   Atrial fibrillation   History of stroke   Nausea vomiting and diarrhea   Abnormal EKG   Discharge Condition: stable and improved. No further bleeding. Discharge to SNF for further physical rehab  Diet recommendation: heart healthy low sodium diet  Filed Weights   01/13/14 0420 01/14/14 0500 01/15/14 0647  Weight: 76.658 kg (169 lb) 78.1 kg (172 lb 2.9 oz) 79.3 kg (174 lb 13.2 oz)    History of present illness:  80 y.o. female with the past medical history of A. Fib on Coumadin, history of stroke, CHF, blindness, hyperlipidemia, depression, anxiety, who presented with nausea, vomiting, diarrhea, GI bleeding, and supratherapeutic INR. Patient reports that in the past 4 weeks, she has been having nausea, vomiting and diarrhea. She vomited occasionally with food materials. She denies any abdominal pain. She has mild diarrhea with 1-2 bowel movements with loose stools each day. Because of blindness, she does not know whether there is blood in the stools. She has generalized weakness. She does not have shortness of breath, chest pain, dizziness, palpitation. No fever, chills, weakness, numbness or tingling sensations in her extremities or leg edema.  Hospital  Course:  Acute blood loss anemia due to GIB: it is most likely due to supratherapeutic INR secondary to Coumadin use. Patient had remote colonoscopy which was normal per patient. Her hemoglobin dropped from 11.3 on 12/2012 to 8.9. Currently patient is hemodynamically stable. No tachycardia and with stable VS. -will discharge to SNF for rehab -ok to resume coumadin; discontinue ASA -follow Hgb trend in 5 days -continue PPI -patient received 1 unit of PRBC during admission.  -lactic acid normal -EGD/colonoscopy w/o explanations for GIB; capsule endoscopy done but results pending; given stability ok to resume coumadin per GI service; they will contact patient with results and further recommendations  Nausea, vomiting and diarrhea: Unclear etiology. Gastroenteritis is a potential differential diagnosis. patient did not have antibiotics use recently. - patient symptoms complete resolved after admission (making less likely bact infection) -no further diarrhea at discharge  supratherapeutic INR: INR[facility]10 [facility] -at discharge INR 1.39 --s/p 1 unit of FFP, 1 unit of PRBC and 15mg  of Vit K during admission -EGD/colonoscopy (11/20) w/o abnormalities or sources for bleeding -Hgb has remained stable; per GI ok to resume coumadin   Afib: heart rate is well controlled. -will resume coumadin at discharge; no bridging therapy  CHF: unknown if its diastolic or systolic congestive heart failure. But condition is chronic and compensated. I will favor diastolic in nature given hx of Atrial Fibrillation . She does not have any leg edema.  -overall compensated; no SOB or orthopnea -will continue lasix -low sodium diet  Hx of stroke: No deficit. Stable -Holding aspirin due to recent GI bleeding -continue Zocor -  ok to resume coumadin  HLD -continue statins  Hypokalemia: -continue supplementation   Physical deconditioning: -will discharge to SNF for  rehab  Procedures: EGD/colonoscopy 11/20: no signs of acute bleeding appreciated on any of them  Capsule endoscopy 11/20: results pending  Consultations:  GI (Dr. Juanda Chance)  Discharge Exam: Filed Vitals:   01/16/14 0506  BP: 131/41  Pulse: 75  Temp: 98.5 F (36.9 C)  Resp: 12    General: Afebrile, denies CP, abd pain or N/V; no further bleeding.   Cardiovascular: controlled rate, positive SEM, no rubs or gallops  Respiratory: CTA bilaterally  Abdomen: soft, NT, ND, positive BS  Musculoskeletal: no edema or cyanosis  Discharge Instructions You were cared for by a hospitalist during your hospital stay. If you have any questions about your discharge medications or the care you received while you were in the hospital after you are discharged, you can call the unit and asked to speak with the hospitalist on call if the hospitalist that took care of you is not available. Once you are discharged, your primary care physician will handle any further medical issues. Please note that NO REFILLS for any discharge medications will be authorized once you are discharged, as it is imperative that you return to your primary care physician (or establish a relationship with a primary care physician if you do not have one) for your aftercare needs so that they can reassess your need for medications and monitor your lab values.  Discharge Instructions    Diet - low sodium heart healthy    Complete by:  As directed      Discharge instructions    Complete by:  As directed   Take medications as prescribed Hold on the use of Aspirin Follow a low sodium diet (less than 2 gram daily) Ok to resume coumadin at home dose; close follow up of INR level Check BMET in 5 days to follow electrolytes and renal function Check CBC in 5 days to follow Hgb trend Rehabilitation as per SNF physical therapy protocol          Current Discharge Medication List    START taking these medications   Details   feeding supplement, RESOURCE BREEZE, (RESOURCE BREEZE) LIQD Take 1 Container by mouth 2 (two) times daily between meals. Refills: 0    OxyCODONE (OXYCONTIN) 10 mg T12A 12 hr tablet Take 1 tablet (10 mg total) by mouth 3 (three) times daily. Qty: 30 tablet, Refills: 0    pantoprazole (PROTONIX) 40 MG tablet Take 1 tablet (40 mg total) by mouth daily at 6 (six) AM.      CONTINUE these medications which have NOT CHANGED   Details  amLODipine (NORVASC) 5 MG tablet Take 5 mg by mouth daily. Refills: 0    furosemide (LASIX) 20 MG tablet Take 20 mg by mouth daily. Refills: 11    hydroxypropyl methylcellulose (ISOPTO TEARS) 2.5 % ophthalmic solution Place 1 drop into both eyes 4 (four) times daily as needed (dry eyes).     lisinopril (PRINIVIL,ZESTRIL) 20 MG tablet Take 20 mg by mouth at bedtime.     potassium chloride SA (K-DUR,KLOR-CON) 20 MEQ tablet Take 20 mEq by mouth daily. Refills: 11    simvastatin (ZOCOR) 40 MG tablet Take 40 mg by mouth every evening.    timolol (TIMOPTIC) 0.5 % ophthalmic solution Place 1 drop into the right eye 2 (two) times daily.    warfarin (COUMADIN) 5 MG tablet Take 5-7.5 mg by mouth daily. 5 mg  daily except 7.5 mg on wednesdays and sundays      STOP taking these medications     aspirin 81 MG tablet      oxyCODONE (OXYCONTIN) 10 MG 12 hr tablet        Allergies  Allergen Reactions  . Septra [Sulfamethoxazole-Trimethoprim] Other (See Comments)    Severe hives  . Contrast Media  [Iodinated Diagnostic Agents] Rash     Reaction : Rash  . Sulfa Antibiotics Rash   Follow-up Information    Follow up with POOL,HENRY A, MD. Schedule an appointment as soon as possible for a visit in 2 weeks.   Specialty:  Neurosurgery   Contact information:   [address] [place] [address] [phone]       Follow up with Lina Sarora Brodie, MD.   Specialty:  Gastroenterology   Why:  office will ocntact you; but in case they don't call office in  5 days for results and information requring needs of further follow up   Contact information:   [address] [place] [address] [phone]       The results of significant diagnostics from this hospitalization (including imaging, microbiology, ancillary and laboratory) are listed below for reference.    Labs: Basic Metabolic Panel:  Recent Labs Lab 01/13/14 0540  NA 143  K 3.3*  CL 101  CO2 29  GLUCOSE 95  BUN 10  CREATININE 0.92  CALCIUM 8.4   Liver Function Tests:  Recent Labs Lab 01/13/14 0540  AST 22  ALT 8  ALKPHOS 62  BILITOT 0.9  PROT 6.0  ALBUMIN 2.8*    Recent Labs Lab 01/13/14 1051  LIPASE 14   CBC:  Recent Labs Lab 01/13/14 1051 01/13/14 2330 01/14/14 0613 01/15/14 0800 01/16/14 0641  WBC 5.0 5.5 5.3 5.5 5.6  HGB 9.4* 8.0* 8.9* 10.5* 10.0*  HCT 30.1* 26.2* 29.7* 35.2* 33.7*  MCV 83.4 83.4 84.6 85.4 83.6  PLT 191 175 191 183 168   Cardiac Enzymes:  Recent Labs Lab 01/13/14 0033 01/13/14 0540 01/13/14 1057  TROPONINI <0.30 <0.30 <0.30    Signed:  Vassie LollMadera, Amoria Mclees  [facility] 01/16/2014, 2:08 PM

## 2014-01-16 NOTE — Progress Notes (Signed)
01/16/14 Patient being transferred to SNF today, IV sites removed and papers ready for Facility, Report to be called .

## 2014-01-17 NOTE — Clinical Social Work Placement (Signed)
Clinical Social Work Department CLINICAL SOCIAL WORK PLACEMENT NOTE 01/17/2014  Patient:  Girtha RmOINDEXTER,Denesha G  Account Number:  [id number] Admit date:  01/12/2014  Clinical Social Worker:  Cherre BlancJOSEPH BRYANT Oluwakemi [place], [place]  Date/time:  01/14/2014 07:57 PM  Clinical Social Work is seeking post-discharge placement for this patient at the following level of care:   SKILLED NURSING   (*CSW will update this form in Epic as items are completed)   01/14/2014  [facility]family [facility]'s list of facilities offering this level of care within the geographic area requested by the patient (or if unable, by the patient's family).  01/14/2014  Patient/family informed of their freedom to choose among providers that offer the needed level of care, that participate in Medicare, Medicaid or managed care program needed by the patient, have an available bed and are willing to accept the patient.  01/14/2014  Patient/family informed of [facility]' ownership interest in [facility], as well as of the fact that they are under no obligation to receive care at this facility.  PASARR submitted to EDS on  PASARR number received on   FL2 transmitted to all facilities in geographic area requested by pt/family on  01/14/2014 FL2 transmitted to all facilities within larger geographic area on   Patient informed that his/her managed care company has contracts with or will negotiate with  certain facilities, including the following:     Patient/family informed of bed offers received:  01/15/2014 Patient chooses bed at Other Physician recommends and patient chooses bed at    Patient to be transferred to Other on  01/16/2014 Patient to be transferred to facility by Family wants to transport Patient and family notified of transfer on 01/16/2014 Name of family member notified:  Husband  The following physician request were entered in Epic:   Additional  Comments:   Per chart patient discharged by weekend CSW.  Roddie McBryant Naven Giambalvo MSW, [place], [place], [phone]

## 2014-01-18 ENCOUNTER — Encounter (HOSPITAL_COMMUNITY): Payer: Self-pay | Admitting: Internal Medicine

## 2020-10-20 ENCOUNTER — Other Ambulatory Visit (HOSPITAL_COMMUNITY): Payer: Self-pay | Admitting: *Deleted

## 2020-10-20 DIAGNOSIS — R1310 Dysphagia, unspecified: Secondary | ICD-10-CM

## 2020-10-24 ENCOUNTER — Ambulatory Visit (HOSPITAL_COMMUNITY)
Admission: RE | Admit: 2020-10-24 | Discharge: 2020-10-24 | Disposition: A | Discharge status: Home / Self Care | Payer: Self-pay | Source: Ambulatory Visit | Attending: Internal Medicine | Admitting: Internal Medicine

## 2020-10-24 ENCOUNTER — Other Ambulatory Visit: Payer: Self-pay

## 2020-10-24 DIAGNOSIS — R1310 Dysphagia, unspecified: Secondary | ICD-10-CM | POA: Insufficient documentation

## 2020-10-24 DIAGNOSIS — J189 Pneumonia, unspecified organism: Secondary | ICD-10-CM | POA: Insufficient documentation

## 2020-10-24 NOTE — Progress Notes (Signed)
Modified Barium Swallow Progress Note  Patient Details  Name: Janice Barrett MRN: [id number] Date of Birth: [birth year]  Today's Date: 10/24/2020  Modified Barium Swallow completed.  Full report located under Chart Review in the Imaging Section.  Brief recommendations include the following:  Clinical Impression  Pt presented with oropharyngeal dysphagia characterized by reduced bolus cohesion and a pharyngeal delay. These resulted in consistent penetration (PAS 5) of thin liquids via cup/straw and once with the final swallow of three consecutive swallows of nectar thick liquids via straw. Aspiration was not observed during the study, but prompted coughing was ineffective in expelling penetrated material, and coughing was noted at the end of the study, so subsequent aspiration is anticipated. Oral and pharyngeal clearance were WFL. Esophageal screening revealed retention and slowed movement of barium in the upper thoracic esophagus. The etiology of this could determined with a dedicated esophageal assessment (e.g., esophagram) to determine whether intervention is available/warranted. Pt's current diet of dysphagia 3/regular texture (inconsistency noted in reports and documentation from facility) may be continued. Nectar thick liquids are recommended with observance of swallowing precautions and SLP services for dysphagia intervention. Pt and her son verbalized agreement with results and recommendations.   Swallow Evaluation Recommendations       SLP Diet Recommendations: Dysphagia 3 (Mech soft) solids;Nectar thick liquid   Liquid Administration via: Cup;Straw   Medication Administration: Whole meds with liquid   Supervision: Staff to assist with self feeding   Compensations: Slow rate;Small sips/bites   Postural Changes: Seated upright at 90 degrees   Oral Care Recommendations: Oral care BID      Rishikesh Khachatryan I. Vear Clock, MS, CCC-SLP Acute Rehabilitation Services Office number  [phone] Pager [phone]  Scheryl Marten 10/24/2020,1:31 PM
